# Patient Record
Sex: Male | Born: 1975 | Race: White | Hispanic: No | Marital: Married | Smoking: Former smoker
Health system: Southern US, Community
[De-identification: ages and names within clinical notes are randomized; demographics above are authoritative.]

## PROBLEM LIST (undated history)

## (undated) DIAGNOSIS — G4730 Sleep apnea, unspecified: Secondary | ICD-10-CM

## (undated) DIAGNOSIS — Z9889 Other specified postprocedural states: Secondary | ICD-10-CM

## (undated) DIAGNOSIS — I10 Essential (primary) hypertension: Secondary | ICD-10-CM

## (undated) DIAGNOSIS — R112 Nausea with vomiting, unspecified: Secondary | ICD-10-CM

## (undated) DIAGNOSIS — K429 Umbilical hernia without obstruction or gangrene: Secondary | ICD-10-CM

## (undated) HISTORY — DX: Sleep apnea, unspecified: G47.30

## (undated) HISTORY — PX: KNEE SURGERY: SHX244

## (undated) HISTORY — PX: TONSILLECTOMY: SUR1361

## (undated) HISTORY — DX: Essential (primary) hypertension: I10

---

## 2009-02-12 IMAGING — CR DG HIP COMPLETE 2+V*L*
2 series · 7 of 7 positions shown · non-contrast
Comparison: NONE

CLINICAL DATA: Hip pain for several months. 

LEFT HIP

[Series 1: view not recorded · 0.17mm/px · 4 of 4 slices shown (1 of 2)]
[im 1/4]
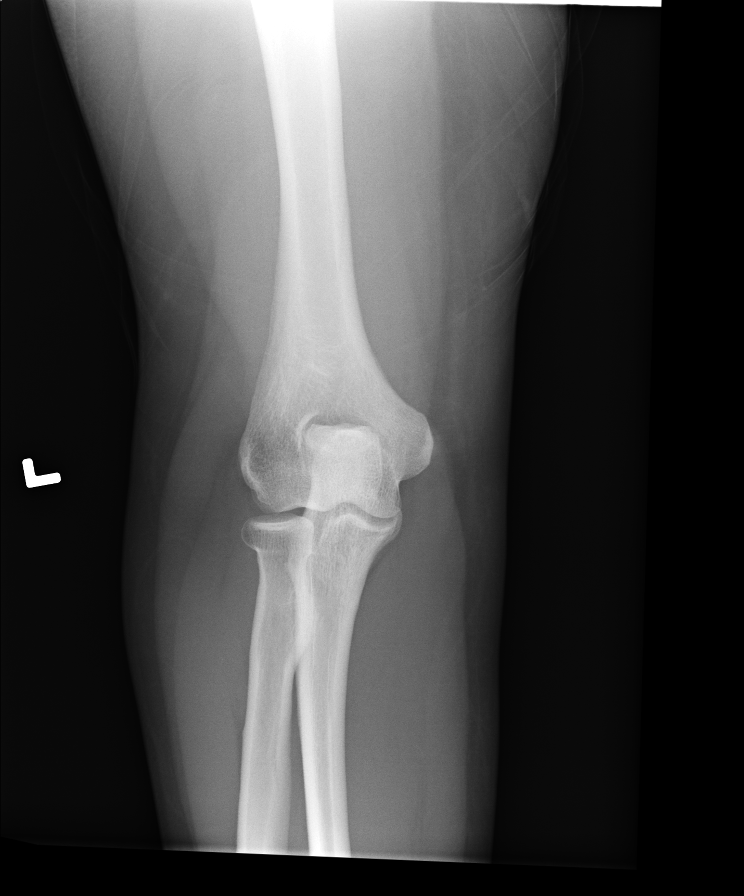
[im 2/4]
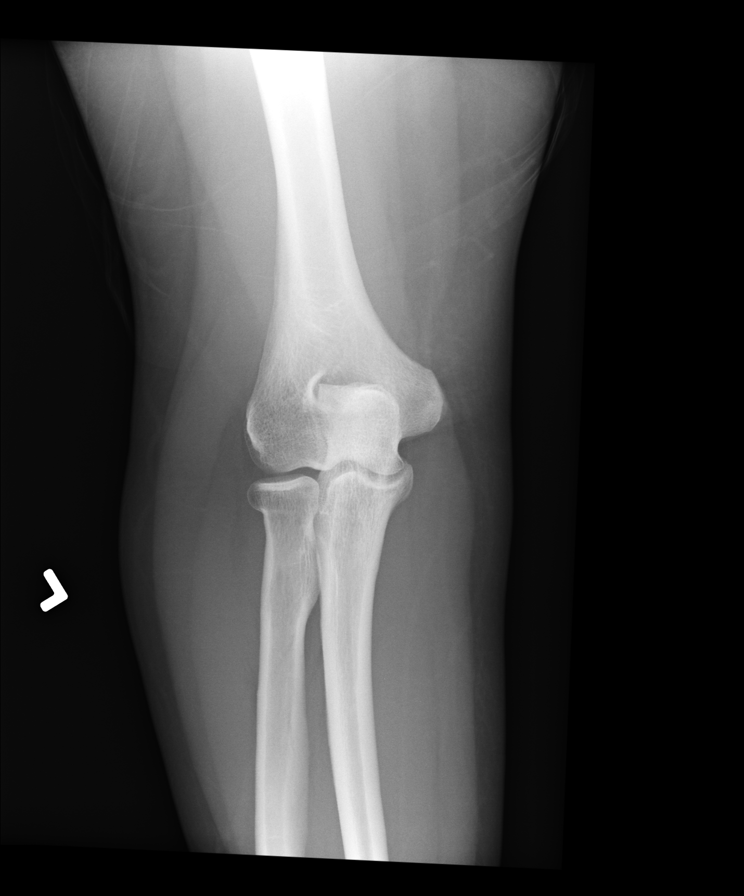
[im 3/4]
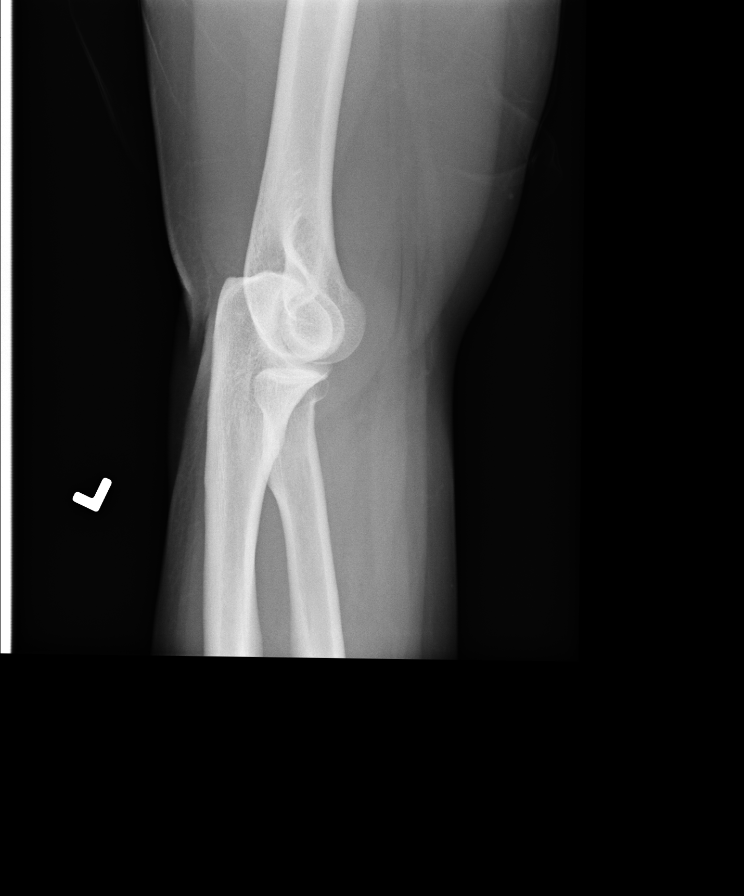
[im 4/4]
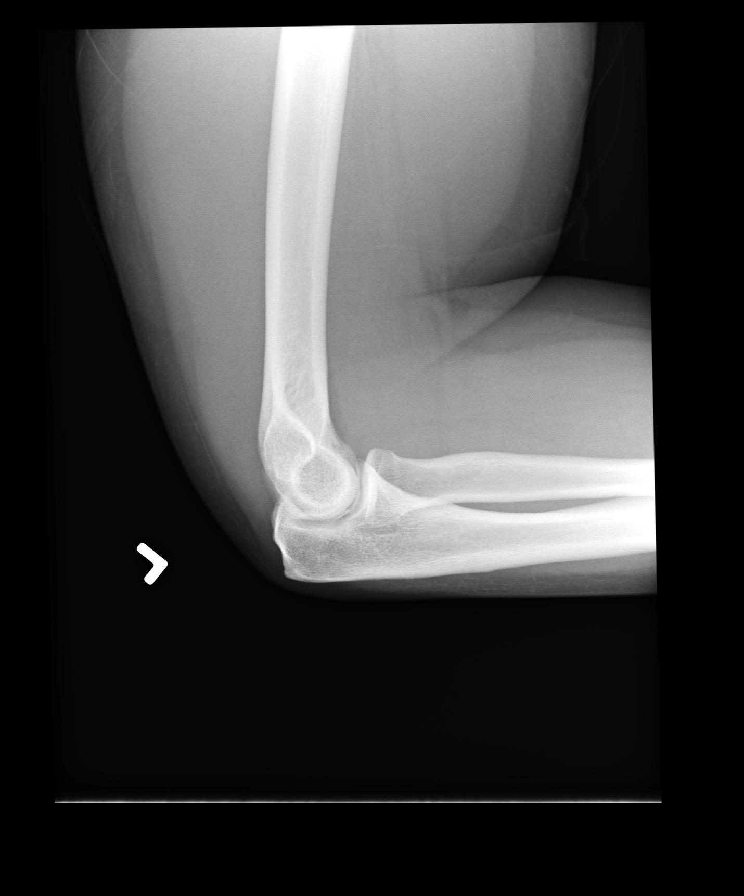

[Series 2: view not recorded · 0.17mm/px · 3 of 3 slices shown (2 of 2)]
[im 1/3]
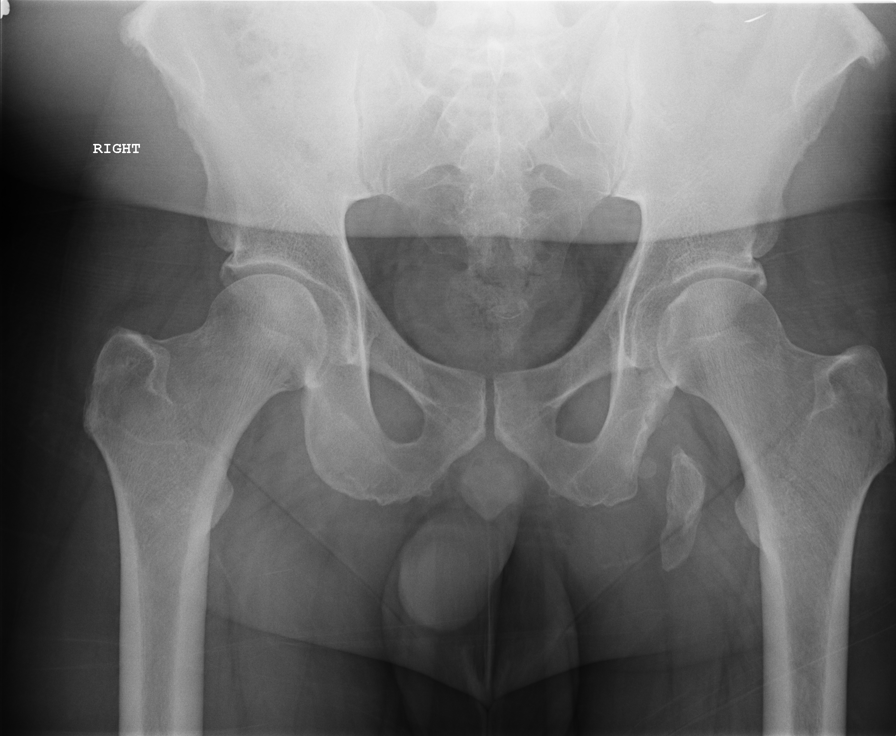
[im 2/3]
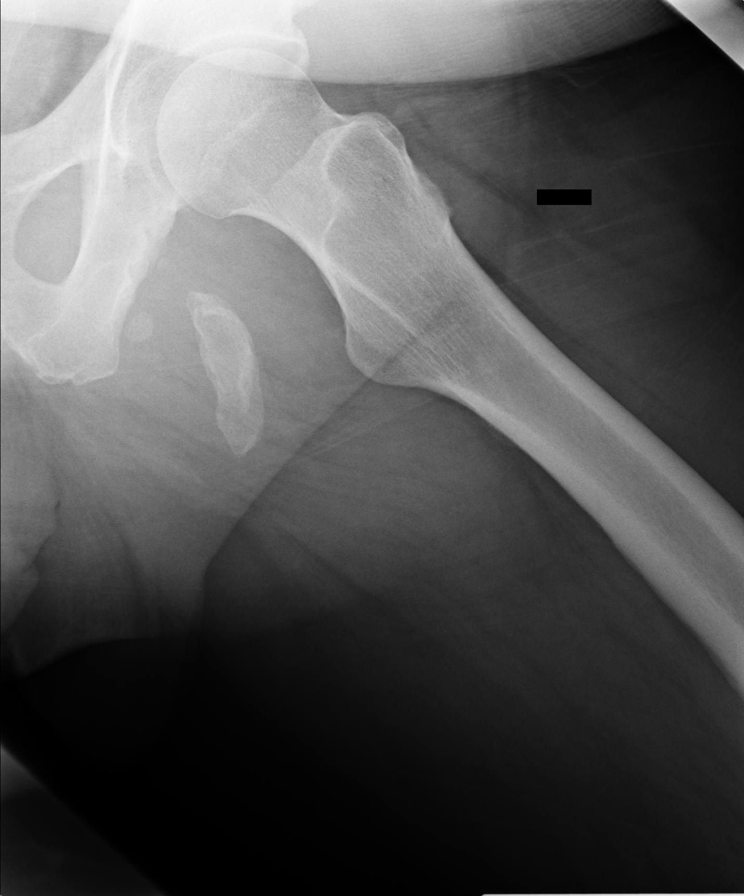
[im 3/3]
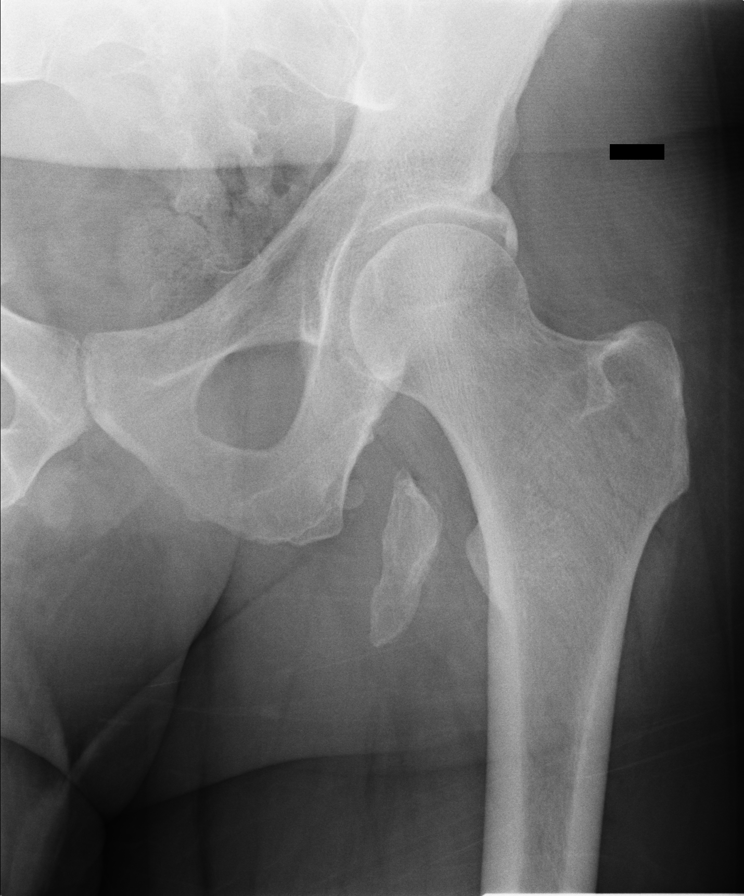

[7 of 7 positions shown; findings below may reference images not displayed]

FINDINGS: No fracture, dislocation, or loose body. There is a 
large calcific density in the medial upper thigh which measures 
approximately 6 cm in length and 1.5 cm in width and depth. This 
most likely represents an area of myositis ossificans.
IMPRESSION: Soft tissue calcification consistent with old injury 
and myositis ossificans. No acute changes involving the left hip. 
Date: 06/15/2007  Tran Date:  06/15/2007 DAS  [REDACTED]

## 2014-08-29 ENCOUNTER — Ambulatory Visit (HOSPITAL_BASED_OUTPATIENT_CLINIC_OR_DEPARTMENT_OTHER): Payer: BLUE CROSS/BLUE SHIELD | Attending: Otolaryngology | Admitting: Radiology

## 2014-08-29 VITALS — Ht 65.0 in | Wt 255.0 lb

## 2014-08-29 DIAGNOSIS — G4733 Obstructive sleep apnea (adult) (pediatric): Secondary | ICD-10-CM | POA: Diagnosis not present

## 2014-08-29 DIAGNOSIS — G4710 Hypersomnia, unspecified: Secondary | ICD-10-CM | POA: Diagnosis present

## 2014-08-29 DIAGNOSIS — R0683 Snoring: Secondary | ICD-10-CM | POA: Diagnosis not present

## 2014-09-03 DIAGNOSIS — R0683 Snoring: Secondary | ICD-10-CM | POA: Diagnosis not present

## 2014-09-03 NOTE — Sleep Study (Signed)
   NAME: Shaun Castillo DATE OF BIRTH:  04/15/1977 MEDICAL RECORD NUMBER [id number]  LOCATION: [facility]  PHYSICIAN: YOUNG,CLINTON D  DATE OF STUDY: 08/29/2014  SLEEP STUDY TYPE: Nocturnal Polysomnogram               REFERRING PHYSICIAN: Jodi Marble, MD  INDICATION FOR STUDY: Hypersomnia with sleep apnea  EPWORTH SLEEPINESS SCORE:   16/24 HEIGHT: 5' 5"  (165.1 cm)  WEIGHT: 115.667 kg (255 lb)    Body mass index is 42.43 kg/(m^2).  NECK SIZE: 19 in.  MEDICATIONS: Charted for review  SLEEP ARCHITECTURE: Split study protocol. During the diagnostic phase, total sleep time 119.5 minutes with sleep efficiency 81.8%. Stage I was 8.8%, stage II 91.2%, stages 3 and REM were absent. Sleep latency 12 minutes, REM latency N/A, awake after sleep onset 14.5 minutes, arousal index 32.6, bedtime medication: None  RESPIRATORY DATA: Apnea hypopnea index (AHI) 23.6 per hour. 47 total events scored including 11 obstructive apneas, 1 mixed apnea, 35 hypopneas. All events were nonsupine. CPAP titration to 11 CWP, AHI 2.4 per hour. He wore a small fullface mask.  OXYGEN DATA: Before CPAP snoring was moderate with oxygen desaturation to a nadir of 90% on room air. With CPAP control, snoring was prevented and mean saturation was 95.5%.  CARDIAC DATA: Normal sinus rhythm  MOVEMENT/PARASOMNIA: No significant movement disturbance, no bathroom trips  IMPRESSION/ RECOMMENDATION:   1) Moderate obstructive sleep apnea/hypopnea syndrome, AHI 23.6 per hour with nonsupine events. Moderate snoring with oxygen desaturation to a nadir of 90% on room air. 2) Successful CPAP titration to 11 CWP, AHI 2.4 per hour. He wore a small ResMed AirFit F-10 fullface mask with heated humidifier. Snoring was prevented and mean oxygen saturation was 95.5%.   Deneise Lever Diplomate, American Board of Sleep Medicine  ELECTRONICALLY SIGNED ON:  09/03/2014, 12:52 PM [place] PH:  [phone]   FX: [phone] [place]

## 2015-01-12 ENCOUNTER — Ambulatory Visit (INDEPENDENT_AMBULATORY_CARE_PROVIDER_SITE_OTHER): Payer: BLUE CROSS/BLUE SHIELD | Admitting: Neurology

## 2015-01-12 ENCOUNTER — Telehealth: Payer: Self-pay | Admitting: *Deleted

## 2015-01-12 ENCOUNTER — Ambulatory Visit: Payer: BLUE CROSS/BLUE SHIELD | Admitting: Neurology

## 2015-01-12 ENCOUNTER — Encounter: Payer: Self-pay | Admitting: Neurology

## 2015-01-12 VITALS — BP 135/79 | HR 84 | Ht 65.0 in | Wt 268.6 lb

## 2015-01-12 DIAGNOSIS — I10 Essential (primary) hypertension: Secondary | ICD-10-CM | POA: Diagnosis not present

## 2015-01-12 DIAGNOSIS — M5416 Radiculopathy, lumbar region: Secondary | ICD-10-CM

## 2015-01-12 DIAGNOSIS — G4733 Obstructive sleep apnea (adult) (pediatric): Secondary | ICD-10-CM

## 2015-01-12 DIAGNOSIS — R29898 Other symptoms and signs involving the musculoskeletal system: Secondary | ICD-10-CM | POA: Diagnosis not present

## 2015-01-12 DIAGNOSIS — G629 Polyneuropathy, unspecified: Secondary | ICD-10-CM | POA: Diagnosis not present

## 2015-01-12 DIAGNOSIS — W19XXXA Unspecified fall, initial encounter: Secondary | ICD-10-CM

## 2015-01-12 MED ORDER — LIDOCAINE 5 % EX PTCH: 3.0000 | MEDICATED_PATCH | CUTANEOUS | Status: AC

## 2015-01-12 MED ORDER — PREGABALIN 75 MG PO CAPS: 75.0000 mg | ORAL_CAPSULE | Freq: Two times a day (BID) | ORAL | Status: AC

## 2015-01-12 NOTE — Patient Instructions (Signed)
Overall you are doing fairly well but I do want to suggest a few things today:   Remember to drink plenty of fluid, eat healthy meals and do not skip any meals. Try to eat protein with a every meal and eat a healthy snack such as fruit or nuts in between meals. Try to keep a regular sleep-wake schedule and try to exercise daily, particularly in the form of walking, 20-30 minutes a day, if you can.   As far as your medications are concerned, I would like to suggest: Lyrica twice daily 36m  As far as diagnostic testing: MRi of the lumbar spine  I would like to see you back after imaging, sooner if we need to. Please call [facility] any interim questions, concerns, problems, updates or refill requests.   Our phone number is [phone] We also have an after hours call service for urgent matters and there is a physician on-call for urgent questions. For any emergencies you know to call 911 or go to the nearest emergency room

## 2015-01-12 NOTE — Progress Notes (Signed)
[facility]    Provider:  Dr Jaynee Eagles Referring Provider: No ref. provider found Primary Care Physician:  Velna Hatchet, MD  CC:  LBP and radicular symptoms  HPI:  Shaun Castillo is a 39 y.o. male here as a referral from Dr. No ref. provider found for LBP and radicular symptoms, numbness and left toe and burning pain down the left lateral thigh. He has a past medical history of hypertension and obesity, sleep apnea on CPAP.  For 8 months, progressive back and hip pain. He has low back pain with radiation into his leg with numbness in his toes. In Addition his left leg also hurts with pain down the outside of his lateral thigh. His left leg will go to sleep, toe will go to sleep, left leg will give out. Pain in the back is worsening. No inciting factors, no trauma or injury. He has gained 16 pounds in 8 months. The back pain started before the weight gain. Pain is described as a burning pain down the outside of the left leg to the knee. The numbness in the toe doesn't coincide with the burning thigh pain and they may be 2 different problems. The burning thigh pain is without association with walking, sitiitng, standing or laying down. However his back pain and toe numbness worsens with walking, His left leg gives out when he is walking with the pain, happened once. He has arm pain mostly joint pain in the elbows. No other focal neurologic symptoms. No changes in bowel or bladder. No saddle or groin anesthesia.  Reviewed notes, labs and imaging from outside physicians, which showed:  Patient brings imaging on a CD with him today. Personally reviewed x-rays of the lumbar spine. Degenerative changes shown most significant at L5-S1. Per report, X are of the left hip showed no acute findings or evidence of significant arthritis, heterotopic bone upper thigh, origin indeterminate.    Review of Systems: Patient complains of symptoms per HPI as well as the following symptoms: Joint pain,  aching muscles, memory loss, numbness, weakness. Pertinent negatives per HPI. All others negative.   Social History   Social History  . Marital Status: Unknown    Spouse Name: N/A  . Number of Children: 2  . Years of Education: BS   Occupational History  . [place] History Main Topics  . Smoking status: Former Smoker    Quit date: 05/06/1988  . Smokeless tobacco: Not on file  . Alcohol Use: No  . Drug Use: No  . Sexual Activity: Not on file   Other Topics Concern  . Not on file   Social History Narrative   Lives at home with Shauna Hugh and [place].   Caffeine use: 2 cups coffee per day    Family History  Problem Relation Age of Onset  . Diabetes Paternal Grandfather     Past Medical History  Diagnosis Date  . Sleep apnea   . Hypertension     Past Surgical History  Procedure Laterality Date  . Knee surgery      knees scoped     Current Outpatient Prescriptions  Medication Sig Dispense Refill  . CHONDROITIN SULFATE PO Take 1 tablet by mouth daily.    Marland Kitchen lisinopril (PRINIVIL,ZESTRIL) 20 MG tablet Take 20 mg by mouth daily.  2  . methocarbamol (ROBAXIN) 500 MG tablet Take 500 mg by mouth as needed.  1  . Multiple Vitamins-Minerals (MULTIVITAMIN ADULT PO) Take 1 tablet by mouth daily.    Marland Kitchen  Omega-3 Fatty Acids (FISH OIL) 1000 MG CAPS Take 1 capsule by mouth daily.    . pregabalin (LYRICA) 50 MG capsule Take 50 mg by mouth 2 (two) times daily.    . Vitamin D, Ergocalciferol, (DRISDOL) 50000 UNITS CAPS capsule Take 5,000 Units by mouth daily.     No current facility-administered medications for this visit.    Allergies as of 01/12/2015 - Review Complete 01/12/2015  Allergen Reaction Noted  . Other  01/12/2015  . Penicillins  01/12/2015    Vitals: BP 135/79 mmHg  Pulse 84  Ht 5' 5"  (1.651 m)  Wt 268 lb 9.6 oz (121.836 kg)  BMI 44.70 kg/m2 Last Weight:  Wt Readings from Last 1 Encounters:  01/12/15 268 lb 9.6 oz (121.836 kg)   Last Height:     Ht Readings from Last 1 Encounters:  01/12/15 5' 5"  (1.651 m)   Physical exam: Exam: Gen: NAD, conversant, well nourised, obese, well groomed                     CV: RRR, no MRG. No Carotid Bruits. No peripheral edema, warm, nontender Eyes: Conjunctivae clear without exudates or hemorrhage  Neuro: Detailed Neurologic Exam  Speech:    Speech is normal; fluent and spontaneous with normal comprehension.  Cognition:    The patient is oriented to person, place, and time;     recent and remote memory intact;     language fluent;     normal attention, concentration,     fund of knowledge Cranial Nerves:    The pupils are equal, round, and reactive to light. The fundi are normal and spontaneous venous pulsations are present. Visual fields are full to finger confrontation. Extraocular movements are intact. Trigeminal sensation is intact and the muscles of mastication are normal. The face is symmetric. The palate elevates in the midline. Hearing intact. Voice is normal. Shoulder shrug is normal. The tongue has normal motion without fasciculations.   Coordination:    Normal finger to nose and heel to shin. Normal rapid alternating movements.   Gait:    Heel-toe and tandem gait are normal.   Motor Observation:    No asymmetry, no atrophy, and no involuntary movements noted. Tone:    Normal muscle tone.    Posture:    Posture is normal. normal erect    Strength:    Strength is V/V in the upper and lower limbs.      Sensation: decreased left lateral cutaneous distribution.      Reflex Exam:  DTR's: Absent left patellar.     Toes:    The toes are downgoing bilaterally.   Clonus:    Clonus is absent.       Assessment/Plan:  39 year old male here for low back pain with radicular left leg symptoms and numbness in the left toe. He also has a burning pain in the left lateral thigh. I wonder if these are 2 separate conditions, the radicular symptoms and numbness in the toe could  be due to an L5-S1 radiculopathy and x-rays of his spine to show degenerative changes at that level. The burning on the left lateral thigh sounds more like meralgia paresthetica. The due to an MRI of the low back to be sure. Discussed this with patient and advised him that loss of weight and wearing loose fitting clothing may help if this indeed is meralgia paresthetica or lateral femoral cutaneous neuropathy. We'll give him Lidoderm patches for the thigh neuropathy and can  try him on Lyrica 75 mg twice daily.  Sarina Ill, MD  [facility] [address] [place], [place] [address]  Phone [phone] Fax [phone]

## 2015-01-12 NOTE — Telephone Encounter (Signed)
Spoke w/ Watt Climes from [facility] and she is going to fax over report for pt Lspine and left hip imaging. Told her he was in the office now with Dr. Jaynee Eagles. She verbalized understanding.

## 2015-01-13 LAB — BASIC METABOLIC PANEL
BUN/Creatinine Ratio: 26 — ABNORMAL HIGH (ref 9–20)
BUN: 19 mg/dL (ref 6–24)
CO2: 24 mmol/L (ref 18–29)
CREATININE: 0.74 mg/dL — AB (ref 0.76–1.27)
Calcium: 9.8 mg/dL (ref 8.7–10.2)
Chloride: 101 mmol/L (ref 97–108)
GFR calc Af Amer: 133 mL/min/{1.73_m2} (ref >59)
GFR, EST NON AFRICAN AMERICAN: 115 mL/min/{1.73_m2} (ref >59)
GLUCOSE: 105 mg/dL — AB (ref 65–99)
Potassium: 5.3 mmol/L — ABNORMAL HIGH (ref 3.5–5.2)
SODIUM: 141 mmol/L (ref 134–144)

## 2015-01-15 DIAGNOSIS — M5416 Radiculopathy, lumbar region: Secondary | ICD-10-CM | POA: Insufficient documentation

## 2015-01-15 DIAGNOSIS — G629 Polyneuropathy, unspecified: Secondary | ICD-10-CM | POA: Insufficient documentation

## 2015-01-15 DIAGNOSIS — I10 Essential (primary) hypertension: Secondary | ICD-10-CM | POA: Insufficient documentation

## 2015-01-15 DIAGNOSIS — G4733 Obstructive sleep apnea (adult) (pediatric): Secondary | ICD-10-CM | POA: Insufficient documentation

## 2015-01-17 ENCOUNTER — Telehealth: Payer: Self-pay | Admitting: *Deleted

## 2015-01-17 NOTE — Telephone Encounter (Signed)
-----   Message from Melvenia Beam, MD sent at 01/16/2015  6:49 PM EDT ----- Let patient know labs were unremarkable thanks

## 2015-01-17 NOTE — Telephone Encounter (Signed)
I called [facility] wk # that labs unremarkable.   If questions could call back.

## 2015-01-18 ENCOUNTER — Other Ambulatory Visit: Payer: BLUE CROSS/BLUE SHIELD

## 2015-01-25 ENCOUNTER — Other Ambulatory Visit: Payer: BLUE CROSS/BLUE SHIELD

## 2015-02-01 ENCOUNTER — Ambulatory Visit: Payer: BLUE CROSS/BLUE SHIELD

## 2015-02-12 ENCOUNTER — Ambulatory Visit
Admission: RE | Admit: 2015-02-12 | Discharge: 2015-02-12 | Disposition: A | Discharge status: Home / Self Care | Payer: BLUE CROSS/BLUE SHIELD | Source: Ambulatory Visit | Attending: Neurology | Admitting: Neurology

## 2015-02-12 DIAGNOSIS — M5416 Radiculopathy, lumbar region: Secondary | ICD-10-CM | POA: Diagnosis not present

## 2015-02-13 ENCOUNTER — Telehealth: Payer: Self-pay | Admitting: Neurology

## 2015-02-13 NOTE — Telephone Encounter (Signed)
Called patient to discuss the following. Left message to call back.  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 There is moderately severe right lateral recess stenosis with some encroachment of the right S1 nerve root but less potential for nerve root compression. Mild degenerative changes at L4-L5 do not lead to any nerve root impingement.

## 2015-02-14 ENCOUNTER — Other Ambulatory Visit: Payer: Self-pay | Admitting: Neurology

## 2015-02-14 DIAGNOSIS — M5417 Radiculopathy, lumbosacral region: Secondary | ICD-10-CM

## 2015-02-14 NOTE — Telephone Encounter (Signed)
Pt called returning Dr Ferdinand Lango' call. He can be reached at [phone]

## 2015-02-14 NOTE — Telephone Encounter (Signed)
Spoke to patient, referral sent place [facility]

## 2015-03-06 NOTE — Telephone Encounter (Signed)
Called [facility] back and spoke with Jeneen Rinks. He requested report be faxed to [phone]. Sent report and received fax confirmation.

## 2015-03-06 NOTE — Telephone Encounter (Signed)
James/[facility] [phone] called requesting MRI report from 02/12/15. They can see the report in [place], states Dr. Jaynee Eagles was to read the MRI. Patient is there now.

## 2015-03-06 NOTE — Telephone Encounter (Signed)
Fax over the report to him, thanks

## 2015-10-31 DIAGNOSIS — G4733 Obstructive sleep apnea (adult) (pediatric): Secondary | ICD-10-CM | POA: Diagnosis not present

## 2015-11-09 DIAGNOSIS — B353 Tinea pedis: Secondary | ICD-10-CM | POA: Diagnosis not present

## 2015-11-09 DIAGNOSIS — B360 Pityriasis versicolor: Secondary | ICD-10-CM | POA: Diagnosis not present

## 2015-11-09 DIAGNOSIS — L814 Other melanin hyperpigmentation: Secondary | ICD-10-CM | POA: Diagnosis not present

## 2015-11-09 DIAGNOSIS — D235 Other benign neoplasm of skin of trunk: Secondary | ICD-10-CM | POA: Diagnosis not present

## 2015-12-28 DIAGNOSIS — L309 Dermatitis, unspecified: Secondary | ICD-10-CM | POA: Diagnosis not present

## 2015-12-28 DIAGNOSIS — L219 Seborrheic dermatitis, unspecified: Secondary | ICD-10-CM | POA: Diagnosis not present

## 2015-12-28 DIAGNOSIS — L718 Other rosacea: Secondary | ICD-10-CM | POA: Diagnosis not present

## 2016-02-01 DIAGNOSIS — G4733 Obstructive sleep apnea (adult) (pediatric): Secondary | ICD-10-CM | POA: Diagnosis not present

## 2016-02-27 DIAGNOSIS — R7309 Other abnormal glucose: Secondary | ICD-10-CM | POA: Diagnosis not present

## 2016-02-27 DIAGNOSIS — I10 Essential (primary) hypertension: Secondary | ICD-10-CM | POA: Diagnosis not present

## 2016-02-27 DIAGNOSIS — Z125 Encounter for screening for malignant neoplasm of prostate: Secondary | ICD-10-CM | POA: Diagnosis not present

## 2016-03-05 DIAGNOSIS — G5702 Lesion of sciatic nerve, left lower limb: Secondary | ICD-10-CM | POA: Diagnosis not present

## 2016-03-05 DIAGNOSIS — Z0000 Encounter for general adult medical examination without abnormal findings: Secondary | ICD-10-CM | POA: Diagnosis not present

## 2016-03-05 DIAGNOSIS — Z1389 Encounter for screening for other disorder: Secondary | ICD-10-CM | POA: Diagnosis not present

## 2016-03-05 DIAGNOSIS — I10 Essential (primary) hypertension: Secondary | ICD-10-CM | POA: Diagnosis not present

## 2016-03-05 DIAGNOSIS — G4733 Obstructive sleep apnea (adult) (pediatric): Secondary | ICD-10-CM | POA: Diagnosis not present

## 2016-05-08 DIAGNOSIS — G4733 Obstructive sleep apnea (adult) (pediatric): Secondary | ICD-10-CM | POA: Diagnosis not present

## 2016-08-13 DIAGNOSIS — G4733 Obstructive sleep apnea (adult) (pediatric): Secondary | ICD-10-CM | POA: Diagnosis not present

## 2016-09-30 ENCOUNTER — Emergency Department (HOSPITAL_COMMUNITY)
Admission: EM | Admit: 2016-09-30 | Discharge: 2016-10-01 | Disposition: A | Discharge status: Home / Self Care | Payer: BLUE CROSS/BLUE SHIELD | Attending: Emergency Medicine | Admitting: Emergency Medicine

## 2016-09-30 ENCOUNTER — Emergency Department (HOSPITAL_COMMUNITY): Payer: BLUE CROSS/BLUE SHIELD

## 2016-09-30 ENCOUNTER — Encounter (HOSPITAL_COMMUNITY): Payer: Self-pay | Admitting: Family Medicine

## 2016-09-30 DIAGNOSIS — Y929 Unspecified place or not applicable: Secondary | ICD-10-CM | POA: Diagnosis not present

## 2016-09-30 DIAGNOSIS — X58XXXA Exposure to other specified factors, initial encounter: Secondary | ICD-10-CM | POA: Insufficient documentation

## 2016-09-30 DIAGNOSIS — Z79899 Other long term (current) drug therapy: Secondary | ICD-10-CM | POA: Insufficient documentation

## 2016-09-30 DIAGNOSIS — Y999 Unspecified external cause status: Secondary | ICD-10-CM | POA: Insufficient documentation

## 2016-09-30 DIAGNOSIS — I10 Essential (primary) hypertension: Secondary | ICD-10-CM | POA: Insufficient documentation

## 2016-09-30 DIAGNOSIS — S99921A Unspecified injury of right foot, initial encounter: Secondary | ICD-10-CM | POA: Diagnosis not present

## 2016-09-30 DIAGNOSIS — Y939 Activity, unspecified: Secondary | ICD-10-CM | POA: Diagnosis not present

## 2016-09-30 DIAGNOSIS — S91311A Laceration without foreign body, right foot, initial encounter: Secondary | ICD-10-CM | POA: Diagnosis not present

## 2016-09-30 DIAGNOSIS — Z87891 Personal history of nicotine dependence: Secondary | ICD-10-CM | POA: Insufficient documentation

## 2016-09-30 DIAGNOSIS — S91114A Laceration without foreign body of right lesser toe(s) without damage to nail, initial encounter: Secondary | ICD-10-CM | POA: Diagnosis not present

## 2016-09-30 MED ORDER — LIDOCAINE HCL (PF) 1 % IJ SOLN
5.0000 mL | Freq: Once | INTRAMUSCULAR | Status: AC
  Administered 2016-10-01: 5 mL via INTRADERMAL
  Filled 2016-09-30: qty 30

## 2016-09-30 NOTE — ED Notes (Signed)
Pt foot soaking in betadine and saline mix.

## 2016-09-30 NOTE — ED Triage Notes (Signed)
Patient reports he stepped on a dianorsor and it caused a laceration to the right little toe. Bleeding is controlled.

## 2016-09-30 NOTE — ED Provider Notes (Signed)
[facility] DEPT Provider Note   CSN: [id number] Arrival date & time: 09/30/16  2038  By signing my name below, I, Jeanell Sparrow, attest that this documentation has been prepared under the direction and in the presence of non-physician practitioner, Volanda Napoleon, PA-C. Electronically Signed: Jeanell Sparrow, Scribe. 09/30/2016. 9:47 PM.  History   Chief Complaint Chief Complaint  Patient presents with  . Extremity Laceration   The history is provided by the patient. No language interpreter was used.   HPI Comments: Shaun Castillo is a 41 y.o. male presents to the Emergency Department complaining of right fifth toe wound that occurred today. He states he accidentally stepped on a toy dinosaur at his house. He was not wearing shoes at the time. Patient reports that he has not taken any medications prior to arrival. Bleeding is controlled on ED arrival. His last tetanus immunization was 4 years ago. Denies any numbness/weakness of the foot.   PCP: Velna Hatchet, MD  Past Medical History:  Diagnosis Date  . Hypertension   . Sleep apnea     Patient Active Problem List   Diagnosis Date Noted  . Left lumbar radiculopathy 01/15/2015  . Neuropathy 01/15/2015  . Morbid obesity ([place]) 01/15/2015  . OSA (obstructive sleep apnea) 01/15/2015  . HTN (hypertension) 01/15/2015    Past Surgical History:  Procedure Laterality Date  . KNEE SURGERY     knees scoped        Home Medications    Prior to Admission medications   Medication Sig Start Date End Date Taking? Authorizing Provider  CHONDROITIN SULFATE PO Take 1 tablet by mouth daily.    [provider]  clindamycin (CLEOCIN) 300 MG capsule Take 1 capsule (300 mg total) by mouth 3 (three) times daily. 10/01/16   Volanda Napoleon, PA-C  HYDROcodone-acetaminophen (NORCO/VICODIN) 5-325 MG tablet Take 2 tablets by mouth every 4 (four) hours as needed. 10/01/16   [facility] A, PA-C  lidocaine (LIDODERM) 5 % Place 3  patches onto the skin daily. Remove & Discard patch within 12 hours or as directed by MD 01/12/15   Melvenia Beam, MD  lisinopril (PRINIVIL,ZESTRIL) 20 MG tablet Take 20 mg by mouth daily. 12/01/14   [provider]  methocarbamol (ROBAXIN) 500 MG tablet Take 500 mg by mouth as needed. 12/29/14   [provider]  Multiple Vitamins-Minerals (MULTIVITAMIN ADULT PO) Take 1 tablet by mouth daily.    [provider]  Omega-3 Fatty Acids (FISH OIL) 1000 MG CAPS Take 1 capsule by mouth daily.    [provider]  pregabalin (LYRICA) 75 MG capsule Take 1 capsule (75 mg total) by mouth 2 (two) times daily. 01/12/15   Melvenia Beam, MD  Vitamin D, Ergocalciferol, (DRISDOL) 50000 UNITS CAPS capsule Take 5,000 Units by mouth daily.    [provider]    Family History Family History  Problem Relation Age of Onset  . Diabetes Paternal Grandfather     Social History Social History  Substance Use Topics  . Smoking status: Former Smoker    Quit date: 05/06/1988  . Smokeless tobacco: Never Used  . Alcohol use No     Allergies   Other and Penicillins   Review of Systems Review of Systems  Skin: Positive for wound (Right foot).  Neurological: Negative for weakness and numbness.     Physical Exam Updated Vital Signs BP (!) 147/75 (BP Location: Left Arm)   Pulse 90   Temp 98.3 F (36.8  C) (Oral)   Resp 18   Ht 5' 5"  (1.651 m)   Wt 256 lb (116.1 kg)   SpO2 100%   BMI 42.60 kg/m   Physical Exam  Constitutional: He appears well-developed and well-nourished.  HENT:  Head: Normocephalic and atraumatic.  Eyes: Conjunctivae and EOM are normal. Right eye exhibits no discharge. Left eye exhibits no discharge. No scleral icterus.  Cardiovascular:  Pulses:      Dorsalis pedis pulses are 2+ on the right side, and 2+ on the left side.  Pulmonary/Chest: Effort normal.  Musculoskeletal:  Full flexion/extension of right ankle intact without difficulty.  Limited flexion of right fifth toe. Full extension intact. Flexion/extension of toes one through 4 of right foot intact fully.  Neurological: He is alert.  Skin: Skin is warm and dry. Capillary refill takes less than 2 seconds.  Right foot: 5th digit has a circumlinear laceration to the base  on the plantar surface.   Psychiatric: He has a normal mood and affect. His speech is normal and behavior is normal.  Nursing note and vitals reviewed.      ED Treatments / Results  DIAGNOSTIC STUDIES: Oxygen Saturation is 100% on RA, normal by my interpretation.    COORDINATION OF CARE: 9:51 PM- Pt advised of plan for treatment and pt agrees.  Labs (all labs ordered are listed, but only abnormal results are displayed) Labs Reviewed - No data to display  EKG  EKG Interpretation None       Radiology Dg Foot Complete Right  Result Date: 09/30/2016 CLINICAL DATA:  Puncture wound of foot. EXAM: RIGHT FOOT COMPLETE - 3+ VIEW COMPARISON:  None. FINDINGS: There is no evidence of fracture or dislocation. There is no evidence of arthropathy. Mild spurring of posterior calcaneus is noted. Soft tissues are unremarkable. IMPRESSION: No acute abnormality seen in the right foot. No radiopaque foreign body is seen. Electronically Signed   By: Marijo Conception, M.D.   On: 09/30/2016 21:52    Procedures .Marland KitchenLaceration Repair Date/Time: 09/30/2016 11:30 PM Performed by: [facility] A Authorized by: [facility] A   Consent:    Consent obtained:  Verbal   Consent given by:  Patient   Risks discussed:  Infection, poor cosmetic result and tendon damage   Alternatives discussed:  No treatment Anesthesia (see MAR for exact dosages):    Anesthesia method:  Local infiltration   Local anesthetic:  Lidocaine 1% w/o epi Laceration details:    Location:  Toe   Toe location:  R little toe Repair type:    Repair type:  Simple Pre-procedure details:    Preparation:  Patient was prepped and draped in  usual sterile fashion and imaging obtained to evaluate for foreign bodies Exploration:    Hemostasis achieved with:  Epinephrine   Wound exploration: wound explored through full range of motion   Treatment:    Area cleansed with:  Betadine   Amount of cleaning:  Extensive   Irrigation solution:  Sterile saline   Irrigation method:  Syringe   Visualized foreign bodies/material removed: no   Skin repair:    Repair method:  Sutures   Suture size:  4-0   Suture material:  Nylon   Suture technique:  Simple interrupted   Number of sutures:  4 Approximation:    Approximation:  Loose Post-procedure details:    Dressing:  Sterile dressing   Patient tolerance of procedure:  Tolerated well, no immediate complications       (including critical  care time)    Medications Ordered in ED Medications  lidocaine (PF) (XYLOCAINE) 1 % injection 5 mL (5 mLs Intradermal Given 10/01/16 0025)  HYDROcodone-acetaminophen (NORCO/VICODIN) 5-325 MG per tablet 2 tablet (2 tablets Oral Given 10/01/16 0025)     Initial Impression / Assessment and Plan / ED Course  I have reviewed the triage vital signs and the nursing notes.  Pertinent labs & imaging results that were available during my care of the patient were reviewed by me and considered in my medical decision making (see chart for details).     41 year old male who presents with right fifth digit laceration after stepping on a plastic toy at home. Obvious laceration to the base of the right fifth toe. Patient is neurovascularly intact. Will obtain x-ray for evaluation of foreign body or acute fracture. Patient states that his last tetanus within the last 4 years.  X-ray reviewed. Negative for any acute fracture or dislocation. Negative for any acute foreign body.   The wound was thoroughly cleaned and soaked in a Betadine/saline solution. After cleaning the wound was extensively irrigated with sterile saline. Once the patient was anesthetized the  wound was fully explored. No visualization of bone or tendon. No visualization of foreign body. Initial pain or the laceration attempted to closely approximate the wound edges. This in turn proved to be increasingly difficult due to the location and natural fold of the skin. Since the wound fold in a natural position and closely aligned, loose sutures were placed to help the wound naturally heal. The wound was repaired as documented above.   Sterile dressing and postop shoe provided. Analgesics provided in department. Plan is the patient within about a coverage. Patient instructed to follow-up with referred podiatrist in the next 24-48 hours for further evaluation. Strict wound care instructions given. Strict return precautions discussed. Patient expresses understanding and agreement to plan.   Final Clinical Impressions(s) / ED Diagnoses   Final diagnoses:  Laceration of right foot, initial encounter    New Prescriptions Discharge Medication List as of 10/01/2016 12:28 AM    START taking these medications   Details  clindamycin (CLEOCIN) 300 MG capsule Take 1 capsule (300 mg total) by mouth 3 (three) times daily., Starting Tue 10/01/2016, Print    HYDROcodone-acetaminophen (NORCO/VICODIN) 5-325 MG tablet Take 2 tablets by mouth every 4 (four) hours as needed., Starting Tue 10/01/2016, Print       I personally performed the services described in this documentation, which was scribed in my presence. The recorded information has been reviewed and is accurate.      Volanda Napoleon, PA-C 10/01/16 0300    Charlesetta Shanks, MD 10/04/16 1431

## 2016-10-01 ENCOUNTER — Ambulatory Visit (INDEPENDENT_AMBULATORY_CARE_PROVIDER_SITE_OTHER): Payer: BLUE CROSS/BLUE SHIELD | Admitting: Podiatry

## 2016-10-01 ENCOUNTER — Encounter: Payer: Self-pay | Admitting: Podiatry

## 2016-10-01 VITALS — BP 155/98 | HR 84

## 2016-10-01 DIAGNOSIS — S99921D Unspecified injury of right foot, subsequent encounter: Secondary | ICD-10-CM | POA: Diagnosis not present

## 2016-10-01 MED ORDER — HYDROCODONE-ACETAMINOPHEN 5-325 MG PO TABS: 2.0000 | ORAL_TABLET | ORAL | 0 refills | Status: AC | PRN

## 2016-10-01 MED ORDER — CLINDAMYCIN HCL 300 MG PO CAPS: 300.0000 mg | ORAL_CAPSULE | Freq: Three times a day (TID) | ORAL | 0 refills | Status: DC

## 2016-10-01 MED ORDER — HYDROCODONE-ACETAMINOPHEN 5-325 MG PO TABS
2.0000 | ORAL_TABLET | Freq: Once | ORAL | Status: AC
  Administered 2016-10-01: 2 via ORAL
  Filled 2016-10-01: qty 2

## 2016-10-01 NOTE — Progress Notes (Signed)
   Subjective:    Patient ID: Shaun Castillo, male    DOB: [birth year], 41 y.o.   MRN: [id number]  HPI this patient presents the office with chief complaint of a laceration under his fifth toe right foot. Patient states that yesterday he stepped on a rubber toe. I that caused a break in the skin under the fifth toe of the right foot. He says he was seen in the emergency room where x-rays were taken and the skin was reapproximated using suture. The x-rays revealed no pathology. He was also given a prescription for antibiotics to be taken for the next week. He was further told to make an appointment with the podiatrist for an evaluation of this injury and follow-up care. He presents the office today wearing the bandage that was applied at the emergency room    Review of Systems  All other systems reviewed and are negative.      Objective:   Physical Exam GENERAL APPEARANCE: Alert, conversant. Appropriately groomed. No acute distress.  VASCULAR: Pedal pulses are  palpable at  [facility] and PT bilateral.  Capillary refill time is immediate to all digits,  Normal temperature gradient.  Digital hair growth is present bilateral  NEUROLOGIC: sensation is normal to 5.07 monofilament at 5/5 sites bilateral.  Light touch is intact bilateral, Muscle strength normal.  MUSCULOSKELETAL: acceptable muscle strength, tone and stability bilateral.  Intrinsic muscluature intact bilateral.  Rectus appearance of foot and digits noted bilateral.   DERMATOLOGIC: skin color, texture, and turgor are within normal limits.  Examination of the skin under the fifth digit right foot at the level of the sulcus reveals good wound coaptation and sutures are intact. No evidence of any drainage or pus, redness or inflammation noted  No drainage noted.  Diagnosis   skin laceration under the sulcus of the fifth toe right foot  Initial exam removal of his bandage and examination of the laceration under the fifth toe right foot. Good wound  coaptation and sutures are intact. No evidence of any pus drainage or infection noted. Bandage was reapplied and the patient was told to keep this bandage on for 2 weeks until his next visit. He was told he he needs to cover the bandage while he showers.  He is to return the office in 2 weeks at which time the sutures will be removed. Call the office for any problems occur   Gardiner Barefoot DPM         Assessment & Plan:

## 2016-10-01 NOTE — Discharge Instructions (Signed)
Keep wound and clean with mild soap and water and covered with a topical antibiotic ointment and bandage.   Ice and elevate for additional pain relief.   Follow-up with the referred foot doctor. Call his office and arrange for any appointment to be seen in the next 24-48 hours.  Alternate between Ibuprofen and Tylenol for additional pain relief.   Follow up with the [facility] or the Emergency Dept in approximately 7 days for wound recheck and suture removal.   Monitor hand for signs of infection to include but not limited to increasing pain, redness, drainage, or swelling. Return to emergency department for emergent changing or worsening symptoms.

## 2016-10-01 NOTE — ED Notes (Signed)
Pt ambulatory and independent at discharge.  Verbalized understanding of discharge instructions 

## 2016-10-02 ENCOUNTER — Encounter: Payer: Self-pay | Admitting: Podiatry

## 2016-10-09 ENCOUNTER — Ambulatory Visit (INDEPENDENT_AMBULATORY_CARE_PROVIDER_SITE_OTHER): Payer: BLUE CROSS/BLUE SHIELD | Admitting: Podiatry

## 2016-10-09 ENCOUNTER — Encounter: Payer: Self-pay | Admitting: Podiatry

## 2016-10-09 VITALS — BP 148/88 | HR 98

## 2016-10-09 DIAGNOSIS — S99921D Unspecified injury of right foot, subsequent encounter: Secondary | ICD-10-CM | POA: Diagnosis not present

## 2016-10-09 NOTE — Progress Notes (Signed)
   Subjective:    Patient ID: Shaun Castillo, male    DOB: Sep 18, 1975, 41 y.o.   MRN: [id number]  HPI this patient presents the office with chief complaint of a laceration under his fifth toe right foot. He was initially seen in the emergency room where x-rays and antibiotics were prescribed. He was then seen in my office and I examined his fifth toe right foot, which revealed the sutures to be intact and no evidence of an infection. I re-bandaged the fifth toe right foot and gave him a surgical shoe. I also gave him light duty at work. He presents the office today for continued evaluation of this foot injury. He says he is having no pain or discomfort as he walks at the site of the injury    Review of Systems  All other systems reviewed and are negative.      Objective:   Physical Exam GENERAL APPEARANCE: Alert, conversant. Appropriately groomed. No acute distress.  VASCULAR: Pedal pulses are  palpable at  [facility] and PT bilateral.  Capillary refill time is immediate to all digits,  Normal temperature gradient.  Digital hair growth is present bilateral  NEUROLOGIC: sensation is normal to 5.07 monofilament at 5/5 sites bilateral.  Light touch is intact bilateral, Muscle strength normal.  MUSCULOSKELETAL: acceptable muscle strength, tone and stability bilateral.  Intrinsic muscluature intact bilateral.  Rectus appearance of foot and digits noted bilateral.   DERMATOLOGIC: skin color, texture, and turgor are within normal limits.  Examination of the skin under the fifth digit Reveals gapping noted at the lateral aspect of the plantar aspect of the fifth toe. No evidence of any redness, swelling or drainage or infection. Examination of the medial aspect of the plantar fifth toe does reveal maceration noted in the absence of any infection  Diagnosis   skin laceration under the sulcus of the fifth toe right foot  Treatment:  Removal of the bandage that he wore to the office for his appointment. Removal of  the sutures was performed. The toe was then re-bandage with Betadine soaked gauze pad and Coban. He was told that he needs to continue to wear his surgical shoe for another week and to keep his toe bandaged.  If there are any problems. He is to call the office and make an appointment for follow-up evaluation   Gardiner Barefoot DPM         Assessment & Plan:

## 2016-10-18 ENCOUNTER — Ambulatory Visit: Payer: Self-pay | Admitting: Podiatry

## 2016-11-15 DIAGNOSIS — G4733 Obstructive sleep apnea (adult) (pediatric): Secondary | ICD-10-CM | POA: Diagnosis not present

## 2017-02-15 DIAGNOSIS — Z0000 Encounter for general adult medical examination without abnormal findings: Secondary | ICD-10-CM | POA: Diagnosis not present

## 2017-02-20 DIAGNOSIS — G4733 Obstructive sleep apnea (adult) (pediatric): Secondary | ICD-10-CM | POA: Diagnosis not present

## 2017-03-14 DIAGNOSIS — G4733 Obstructive sleep apnea (adult) (pediatric): Secondary | ICD-10-CM | POA: Diagnosis not present

## 2017-03-14 DIAGNOSIS — Z23 Encounter for immunization: Secondary | ICD-10-CM | POA: Diagnosis not present

## 2017-03-14 DIAGNOSIS — K429 Umbilical hernia without obstruction or gangrene: Secondary | ICD-10-CM | POA: Diagnosis not present

## 2017-03-14 DIAGNOSIS — Z0000 Encounter for general adult medical examination without abnormal findings: Secondary | ICD-10-CM | POA: Diagnosis not present

## 2017-03-14 DIAGNOSIS — Z1389 Encounter for screening for other disorder: Secondary | ICD-10-CM | POA: Diagnosis not present

## 2017-03-14 DIAGNOSIS — I10 Essential (primary) hypertension: Secondary | ICD-10-CM | POA: Diagnosis not present

## 2017-04-03 DIAGNOSIS — M9905 Segmental and somatic dysfunction of pelvic region: Secondary | ICD-10-CM | POA: Diagnosis not present

## 2017-04-03 DIAGNOSIS — M9903 Segmental and somatic dysfunction of lumbar region: Secondary | ICD-10-CM | POA: Diagnosis not present

## 2017-04-03 DIAGNOSIS — M62830 Muscle spasm of back: Secondary | ICD-10-CM | POA: Diagnosis not present

## 2017-04-03 DIAGNOSIS — M9904 Segmental and somatic dysfunction of sacral region: Secondary | ICD-10-CM | POA: Diagnosis not present

## 2017-04-11 DIAGNOSIS — K429 Umbilical hernia without obstruction or gangrene: Secondary | ICD-10-CM | POA: Diagnosis not present

## 2017-04-15 ENCOUNTER — Ambulatory Visit: Payer: Self-pay | Admitting: General Surgery

## 2017-04-15 NOTE — H&P (Signed)
History of Present Illness  The patient is a 41 year old male who presents with an umbilical hernia. Referred by: Dr. Velna Hatchet Chief Complaint: Umbilical hernia  Patient is a 41 year old male with a past medical history sent for hypertension, sleep apnea, consistent with umbilical hernia. Patient states is been there for several years. He states that there is some pain and discomfort umbilical area. He states that this is more noticeable when playing with his children. He does state that he has some constipation as well as some abdominal pain secondary to the hernia itself. Patient had no signs or symptoms of strangulation. He does state that it stays fairly/incarcerated. There are no other modifying factors.    Past Surgical History Knee Surgery  Bilateral. Tonsillectomy   Allergies  Penicillins  Allergies Reconciled   Medication History  Lisinopril (20MG Tablet, Oral) Active. Medications Reconciled  Social History  Alcohol use  Occasional alcohol use. Caffeine use  Carbonated beverages, Coffee, Tea. No drug use  Tobacco use  Never smoker.  Family History  Arthritis  Father, Mother. Hypertension  Mother.  Other Problems  Umbilical Hernia Repair     Review of Systems  General Not Present- Appetite Loss, Chills, Fatigue, Fever, Night Sweats, Weight Gain and Weight Loss. Skin Not Present- Change in Wart/Mole, Dryness, Hives, Jaundice, New Lesions, Non-Healing Wounds, Rash and Ulcer. HEENT Not Present- Earache, Hearing Loss, Hoarseness, Nose Bleed, Oral Ulcers, Ringing in the Ears, Seasonal Allergies, Sinus Pain, Sore Throat, Visual Disturbances, Wears glasses/contact lenses and Yellow Eyes. Respiratory Not Present- Bloody sputum, Chronic Cough, Difficulty Breathing, Snoring and Wheezing. Breast Not Present- Breast Mass, Breast Pain, Nipple Discharge and Skin Changes. Cardiovascular Not Present- Chest Pain, Difficulty Breathing Lying Down, Leg Cramps,  Palpitations, Rapid Heart Rate, Shortness of Breath and Swelling of Extremities. Gastrointestinal Present- Change in Bowel Habits. Not Present- Abdominal Pain, Bloating, Bloody Stool, Chronic diarrhea, Constipation, Difficulty Swallowing, Excessive gas, Gets full quickly at meals, Hemorrhoids, Indigestion, Nausea, Rectal Pain and Vomiting. Male Genitourinary Not Present- Blood in Urine, Change in Urinary Stream, Frequency, Impotence, Nocturia, Painful Urination, Urgency and Urine Leakage. Musculoskeletal Not Present- Back Pain, Joint Pain, Joint Stiffness, Muscle Pain, Muscle Weakness and Swelling of Extremities. Neurological Not Present- Decreased Memory, Fainting, Headaches, Numbness, Seizures, Tingling, Tremor, Trouble walking and Weakness. Psychiatric Not Present- Anxiety, Bipolar, Change in Sleep Pattern, Depression, Fearful and Frequent crying. Endocrine Not Present- Cold Intolerance, Excessive Hunger, Hair Changes, Heat Intolerance, Hot flashes and New Diabetes. Hematology Not Present- Blood Thinners, Easy Bruising, Excessive bleeding, Gland problems, HIV and Persistent Infections. All other systems negative  Vitals 04/11/2017 3:31 PM Weight: 259.5 lb Height: 65in Body Surface Area: 2.21 m Body Mass Index: 43.18 kg/m  Temp.: 97.73F  Pulse: 86 (Regular)  BP: 134/82 (Sitting, Left Arm, Standard)       Physical Exam  The physical exam findings are as follows: Note:Constitutional: No acute distress, conversant, appears stated age  Eyes: Anicteric sclerae, moist conjunctiva, no lid lag  Neck: No thyromegaly, trachea midline, no cervical lymphadenopathy  Lungs: Clear to auscultation biilaterally, normal respiratory effot  Cardiovascular: regular rate & rhythm, no murmurs, no peripheal edema, pedal pulses 2+  GI: Soft, no masses or hepatosplenomegaly, non-tender to palpation  MSK: Normal gait, no clubbing cyanosis, edema  Skin: No rashes, palpation reveals normal  skin turgor  Psychiatric: Appropriate judgment and insight, oriented to person, place, and time  Abdomen Inspection Hernias - Umbilical hernia - Incarcerated.    Assessment & Plan UMBILICAL HERNIA WITHOUT OBSTRUCTION  AND WITHOUT GANGRENE (K42.9) Impression: 41 year old male with a incarcerated umbilical hernia, chronically.  1. The patient will like to proceed to the operating room for laparoscopic umbilical hernia repair with mesh.  2. I discussed with the patient the signs and symptoms of incarceration and strangulation and the need to proceed to the ER should they occur.  3. I discussed with the patient the risks and benefits of the procedure to include but not limited to: Infection, bleeding, damage to surrounding structures, possible need for further surgery, possible nerve pain, and possible recurrence. The patient was understanding and wishes to proceed.

## 2017-04-15 NOTE — H&P (View-Only) (Signed)
History of Present Illness  The patient is a 41 year old male who presents with an umbilical hernia. Referred by: Dr. Velna Hatchet Chief Complaint: Umbilical hernia  Patient is a 42 year old male with a past medical history sent for hypertension, sleep apnea, consistent with umbilical hernia. Patient states is been there for several years. He states that there is some pain and discomfort umbilical area. He states that this is more noticeable when playing with his children. He does state that he has some constipation as well as some abdominal pain secondary to the hernia itself. Patient had no signs or symptoms of strangulation. He does state that it stays fairly/incarcerated. There are no other modifying factors.    Past Surgical History Knee Surgery  Bilateral. Tonsillectomy   Allergies  Penicillins  Allergies Reconciled   Medication History  Lisinopril (20MG Tablet, Oral) Active. Medications Reconciled  Social History  Alcohol use  Occasional alcohol use. Caffeine use  Carbonated beverages, Coffee, Tea. No drug use  Tobacco use  Never smoker.  Family History  Arthritis  Father, Mother. Hypertension  Mother.  Other Problems  Umbilical Hernia Repair     Review of Systems  General Not Present- Appetite Loss, Chills, Fatigue, Fever, Night Sweats, Weight Gain and Weight Loss. Skin Not Present- Change in Wart/Mole, Dryness, Hives, Jaundice, New Lesions, Non-Healing Wounds, Rash and Ulcer. HEENT Not Present- Earache, Hearing Loss, Hoarseness, Nose Bleed, Oral Ulcers, Ringing in the Ears, Seasonal Allergies, Sinus Pain, Sore Throat, Visual Disturbances, Wears glasses/contact lenses and Yellow Eyes. Respiratory Not Present- Bloody sputum, Chronic Cough, Difficulty Breathing, Snoring and Wheezing. Breast Not Present- Breast Mass, Breast Pain, Nipple Discharge and Skin Changes. Cardiovascular Not Present- Chest Pain, Difficulty Breathing Lying Down, Leg Cramps,  Palpitations, Rapid Heart Rate, Shortness of Breath and Swelling of Extremities. Gastrointestinal Present- Change in Bowel Habits. Not Present- Abdominal Pain, Bloating, Bloody Stool, Chronic diarrhea, Constipation, Difficulty Swallowing, Excessive gas, Gets full quickly at meals, Hemorrhoids, Indigestion, Nausea, Rectal Pain and Vomiting. Male Genitourinary Not Present- Blood in Urine, Change in Urinary Stream, Frequency, Impotence, Nocturia, Painful Urination, Urgency and Urine Leakage. Musculoskeletal Not Present- Back Pain, Joint Pain, Joint Stiffness, Muscle Pain, Muscle Weakness and Swelling of Extremities. Neurological Not Present- Decreased Memory, Fainting, Headaches, Numbness, Seizures, Tingling, Tremor, Trouble walking and Weakness. Psychiatric Not Present- Anxiety, Bipolar, Change in Sleep Pattern, Depression, Fearful and Frequent crying. Endocrine Not Present- Cold Intolerance, Excessive Hunger, Hair Changes, Heat Intolerance, Hot flashes and New Diabetes. Hematology Not Present- Blood Thinners, Easy Bruising, Excessive bleeding, Gland problems, HIV and Persistent Infections. All other systems negative  Vitals 04/11/2017 3:31 PM Weight: 259.5 lb Height: 65in Body Surface Area: 2.21 m Body Mass Index: 43.18 kg/m  Temp.: 97.34F  Pulse: 86 (Regular)  BP: 134/82 (Sitting, Left Arm, Standard)       Physical Exam  The physical exam findings are as follows: Note:Constitutional: No acute distress, conversant, appears stated age  Eyes: Anicteric sclerae, moist conjunctiva, no lid lag  Neck: No thyromegaly, trachea midline, no cervical lymphadenopathy  Lungs: Clear to auscultation biilaterally, normal respiratory effot  Cardiovascular: regular rate & rhythm, no murmurs, no peripheal edema, pedal pulses 2+  GI: Soft, no masses or hepatosplenomegaly, non-tender to palpation  MSK: Normal gait, no clubbing cyanosis, edema  Skin: No rashes, palpation reveals normal  skin turgor  Psychiatric: Appropriate judgment and insight, oriented to person, place, and time  Abdomen Inspection Hernias - Umbilical hernia - Incarcerated.    Assessment & Plan UMBILICAL HERNIA WITHOUT OBSTRUCTION  AND WITHOUT GANGRENE (K42.9) Impression: 41 year old male with a incarcerated umbilical hernia, chronically.  1. The patient will like to proceed to the operating room for laparoscopic umbilical hernia repair with mesh.  2. I discussed with the patient the signs and symptoms of incarceration and strangulation and the need to proceed to the ER should they occur.  3. I discussed with the patient the risks and benefits of the procedure to include but not limited to: Infection, bleeding, damage to surrounding structures, possible need for further surgery, possible nerve pain, and possible recurrence. The patient was understanding and wishes to proceed.

## 2017-04-23 ENCOUNTER — Encounter (HOSPITAL_COMMUNITY): Payer: Self-pay

## 2017-04-23 ENCOUNTER — Encounter (HOSPITAL_COMMUNITY)
Admission: RE | Admit: 2017-04-23 | Discharge: 2017-04-23 | Disposition: A | Discharge status: Home / Self Care | Payer: BLUE CROSS/BLUE SHIELD | Source: Ambulatory Visit | Attending: General Surgery | Admitting: General Surgery

## 2017-04-23 ENCOUNTER — Other Ambulatory Visit: Payer: Self-pay

## 2017-04-23 DIAGNOSIS — Z01812 Encounter for preprocedural laboratory examination: Secondary | ICD-10-CM | POA: Insufficient documentation

## 2017-04-23 DIAGNOSIS — I10 Essential (primary) hypertension: Secondary | ICD-10-CM | POA: Diagnosis not present

## 2017-04-23 DIAGNOSIS — Z01810 Encounter for preprocedural cardiovascular examination: Secondary | ICD-10-CM | POA: Diagnosis not present

## 2017-04-23 HISTORY — DX: Other specified postprocedural states: Z98.890

## 2017-04-23 HISTORY — DX: Nausea with vomiting, unspecified: R11.2

## 2017-04-23 HISTORY — DX: Umbilical hernia without obstruction or gangrene: K42.9

## 2017-04-23 LAB — BASIC METABOLIC PANEL
ANION GAP: 7 (ref 5–15)
BUN: 15 mg/dL (ref 6–20)
CO2: 28 mmol/L (ref 22–32)
Calcium: 9.5 mg/dL (ref 8.9–10.3)
Chloride: 104 mmol/L (ref 101–111)
Creatinine, Ser: 0.79 mg/dL (ref 0.61–1.24)
GFR calc Af Amer: >60 mL/min (ref >60)
GLUCOSE: 98 mg/dL (ref 65–99)
POTASSIUM: 4.7 mmol/L (ref 3.5–5.1)
Sodium: 139 mmol/L (ref 135–145)

## 2017-04-23 LAB — CBC
HEMATOCRIT: 45.4 % (ref 39.0–52.0)
HEMOGLOBIN: 14.9 g/dL (ref 13.0–17.0)
MCH: 28.8 pg (ref 26.0–34.0)
MCHC: 32.8 g/dL (ref 30.0–36.0)
MCV: 87.6 fL (ref 78.0–100.0)
Platelets: 252 10*3/uL (ref 150–400)
RBC: 5.18 MIL/uL (ref 4.22–5.81)
RDW: 13.6 % (ref 11.5–15.5)
WBC: 12.6 10*3/uL — ABNORMAL HIGH (ref 4.0–10.5)

## 2017-04-23 NOTE — Patient Instructions (Signed)
Shaun Castillo  04/23/2017   Your procedure is scheduled on: 04-30-17  Report to [facility] Main  Entrance Take [place]  elevators to 3rd floor to  [place] at 7:45AM.   Call this number if you have problems the morning of surgery [phone]    Remember: ONLY 1 PERSON MAY GO WITH YOU TO SHORT STAY TO GET  READY MORNING OF [name].    Do not eat food or drink liquids :After Midnight.    PLEASE BRING CPAP MASK AND TUBING ONLY TO HOSPITAL. DEVICE WILL BE PROVIDED !!   Take these medicines the morning of surgery with A SIP OF WATER: NONE                                You may not have any metal on your body including hair pins and              piercings  Do not wear jewelry, make-up, lotions, powders or perfumes, deodorant                     Men may shave face and neck.   Do not bring valuables to the hospital. [name].  Contacts, dentures or bridgework may not be worn into surgery.      Patients discharged the day of surgery will not be allowed to drive home.  Name and phone number of your driver:  Special Instructions: N/A              Please read over the following fact sheets you were given: _____________________________________________________________________             [facility] - Preparing for Surgery Before surgery, you can play an important role.  Because skin is not sterile, your skin needs to be as free of germs as possible.  You can reduce the number of germs on your skin by washing with CHG (chlorahexidine gluconate) soap before surgery.  CHG is an antiseptic cleaner which kills germs and bonds with the skin to continue killing germs even after washing. Please DO NOT use if you have an allergy to CHG or antibacterial soaps.  If your skin becomes reddened/irritated stop using the CHG and inform your nurse when you arrive at Short Stay. Do not shave (including legs and  underarms) for at least 48 hours prior to the first CHG shower.  You may shave your face/neck. Please follow these instructions carefully:  1.  Shower with CHG Soap the night before surgery and the  morning of Surgery.  2.  If you choose to wash your hair, wash your hair first as usual with your  normal  shampoo.  3.  After you shampoo, rinse your hair and body thoroughly to remove the  shampoo.                           4.  Use CHG as you would any other liquid soap.  You can apply chg directly  to the skin and wash                       Gently with a scrungie or  clean washcloth.  5.  Apply the CHG Soap to your body ONLY FROM THE NECK DOWN.   Do not use on face/ open                           Wound or open sores. Avoid contact with eyes, ears mouth and genitals (private parts).                       Wash face,  Genitals (private parts) with your normal soap.             6.  Wash thoroughly, paying special attention to the area where your surgery  will be performed.  7.  Thoroughly rinse your body with warm water from the neck down.  8.  DO NOT shower/wash with your normal soap after using and rinsing off  the CHG Soap.                9.  Pat yourself dry with a clean towel.            10.  Wear clean pajamas.            11.  Place clean sheets on your bed the night of your first shower and do not  sleep with pets. Day of Surgery : Do not apply any lotions/deodorants the morning of surgery.  Please wear clean clothes to the hospital/surgery center.  FAILURE TO FOLLOW THESE INSTRUCTIONS MAY RESULT IN THE CANCELLATION OF YOUR SURGERY PATIENT SIGNATURE_________________________________  NURSE SIGNATURE__________________________________  ________________________________________________________________________

## 2017-04-24 DIAGNOSIS — M62830 Muscle spasm of back: Secondary | ICD-10-CM | POA: Diagnosis not present

## 2017-04-24 DIAGNOSIS — M9903 Segmental and somatic dysfunction of lumbar region: Secondary | ICD-10-CM | POA: Diagnosis not present

## 2017-04-24 DIAGNOSIS — M9904 Segmental and somatic dysfunction of sacral region: Secondary | ICD-10-CM | POA: Diagnosis not present

## 2017-04-24 DIAGNOSIS — M9905 Segmental and somatic dysfunction of pelvic region: Secondary | ICD-10-CM | POA: Diagnosis not present

## 2017-04-29 MED ORDER — VANCOMYCIN HCL 10 G IV SOLR
1500.0000 mg | INTRAVENOUS | Status: AC
  Administered 2017-04-30: 1500 mg via INTRAVENOUS
  Filled 2017-04-29: qty 1500

## 2017-04-30 ENCOUNTER — Ambulatory Visit (HOSPITAL_COMMUNITY): Payer: BLUE CROSS/BLUE SHIELD | Admitting: Anesthesiology

## 2017-04-30 ENCOUNTER — Encounter (HOSPITAL_COMMUNITY): Payer: Self-pay | Admitting: Emergency Medicine

## 2017-04-30 ENCOUNTER — Ambulatory Visit (HOSPITAL_COMMUNITY)
Admission: RE | Admit: 2017-04-30 | Discharge: 2017-04-30 | Disposition: A | Discharge status: Home / Self Care | Payer: BLUE CROSS/BLUE SHIELD | Source: Ambulatory Visit | Attending: General Surgery | Admitting: General Surgery

## 2017-04-30 ENCOUNTER — Other Ambulatory Visit: Payer: Self-pay

## 2017-04-30 ENCOUNTER — Encounter (HOSPITAL_COMMUNITY)
Admission: RE | Disposition: A | Discharge status: Home / Self Care | Payer: Self-pay | Source: Ambulatory Visit | Attending: General Surgery

## 2017-04-30 DIAGNOSIS — Z8261 Family history of arthritis: Secondary | ICD-10-CM | POA: Insufficient documentation

## 2017-04-30 DIAGNOSIS — Z79899 Other long term (current) drug therapy: Secondary | ICD-10-CM | POA: Diagnosis not present

## 2017-04-30 DIAGNOSIS — Z880 Allergy status to penicillin: Secondary | ICD-10-CM | POA: Diagnosis not present

## 2017-04-30 DIAGNOSIS — Z9889 Other specified postprocedural states: Secondary | ICD-10-CM | POA: Insufficient documentation

## 2017-04-30 DIAGNOSIS — I10 Essential (primary) hypertension: Secondary | ICD-10-CM | POA: Insufficient documentation

## 2017-04-30 DIAGNOSIS — M5416 Radiculopathy, lumbar region: Secondary | ICD-10-CM | POA: Diagnosis not present

## 2017-04-30 DIAGNOSIS — Z8249 Family history of ischemic heart disease and other diseases of the circulatory system: Secondary | ICD-10-CM | POA: Diagnosis not present

## 2017-04-30 DIAGNOSIS — Z87891 Personal history of nicotine dependence: Secondary | ICD-10-CM | POA: Diagnosis not present

## 2017-04-30 DIAGNOSIS — G4730 Sleep apnea, unspecified: Secondary | ICD-10-CM | POA: Insufficient documentation

## 2017-04-30 DIAGNOSIS — K420 Umbilical hernia with obstruction, without gangrene: Secondary | ICD-10-CM | POA: Diagnosis not present

## 2017-04-30 DIAGNOSIS — K429 Umbilical hernia without obstruction or gangrene: Secondary | ICD-10-CM | POA: Diagnosis not present

## 2017-04-30 DIAGNOSIS — K5900 Constipation, unspecified: Secondary | ICD-10-CM | POA: Insufficient documentation

## 2017-04-30 DIAGNOSIS — Z96653 Presence of artificial knee joint, bilateral: Secondary | ICD-10-CM | POA: Diagnosis not present

## 2017-04-30 DIAGNOSIS — G4733 Obstructive sleep apnea (adult) (pediatric): Secondary | ICD-10-CM | POA: Diagnosis not present

## 2017-04-30 HISTORY — PX: UMBILICAL HERNIA REPAIR: SHX196

## 2017-04-30 HISTORY — PX: INSERTION OF MESH: SHX5868

## 2017-04-30 SURGERY — REPAIR, HERNIA, UMBILICAL, LAPAROSCOPIC
Anesthesia: General | Site: Abdomen

## 2017-04-30 MED ORDER — PROMETHAZINE HCL 25 MG/ML IJ SOLN: 6.2500 mg | INTRAMUSCULAR | Status: DC | PRN

## 2017-04-30 MED ORDER — PROPOFOL 10 MG/ML IV BOLUS
INTRAVENOUS | Status: AC
  Filled 2017-04-30: qty 20

## 2017-04-30 MED ORDER — LACTATED RINGERS IR SOLN
Status: DC | PRN
  Administered 2017-04-30: 1000 mL

## 2017-04-30 MED ORDER — FENTANYL CITRATE (PF) 100 MCG/2ML IJ SOLN
INTRAMUSCULAR | Status: AC
  Filled 2017-04-30: qty 2

## 2017-04-30 MED ORDER — SUCCINYLCHOLINE CHLORIDE 200 MG/10ML IV SOSY
PREFILLED_SYRINGE | INTRAVENOUS | Status: DC | PRN
  Administered 2017-04-30: 200 mg via INTRAVENOUS

## 2017-04-30 MED ORDER — FENTANYL CITRATE (PF) 100 MCG/2ML IJ SOLN
25.0000 ug | INTRAMUSCULAR | Status: DC | PRN
  Administered 2017-04-30 (×2): 50 ug via INTRAVENOUS

## 2017-04-30 MED ORDER — PROPOFOL 10 MG/ML IV BOLUS
INTRAVENOUS | Status: DC | PRN
  Administered 2017-04-30: 180 mg via INTRAVENOUS

## 2017-04-30 MED ORDER — LIDOCAINE 2% (20 MG/ML) 5 ML SYRINGE
INTRAMUSCULAR | Status: DC | PRN
  Administered 2017-04-30: 50 mg via INTRAVENOUS

## 2017-04-30 MED ORDER — ROCURONIUM BROMIDE 10 MG/ML (PF) SYRINGE
PREFILLED_SYRINGE | INTRAVENOUS | Status: DC | PRN
  Administered 2017-04-30: 40 mg via INTRAVENOUS
  Administered 2017-04-30 (×2): 10 mg via INTRAVENOUS

## 2017-04-30 MED ORDER — CHLORHEXIDINE GLUCONATE CLOTH 2 % EX PADS: 6.0000 | MEDICATED_PAD | Freq: Once | CUTANEOUS | Status: DC

## 2017-04-30 MED ORDER — DEXAMETHASONE SODIUM PHOSPHATE 10 MG/ML IJ SOLN
INTRAMUSCULAR | Status: DC | PRN
  Administered 2017-04-30: 10 mg via INTRAVENOUS

## 2017-04-30 MED ORDER — MEPERIDINE HCL 50 MG/ML IJ SOLN: 6.2500 mg | INTRAMUSCULAR | Status: DC | PRN

## 2017-04-30 MED ORDER — FENTANYL CITRATE (PF) 250 MCG/5ML IJ SOLN
INTRAMUSCULAR | Status: AC
  Filled 2017-04-30: qty 5

## 2017-04-30 MED ORDER — MIDAZOLAM HCL 2 MG/2ML IJ SOLN
INTRAMUSCULAR | Status: AC
  Filled 2017-04-30: qty 2

## 2017-04-30 MED ORDER — FENTANYL CITRATE (PF) 100 MCG/2ML IJ SOLN
INTRAMUSCULAR | Status: DC | PRN
  Administered 2017-04-30 (×2): 50 ug via INTRAVENOUS
  Administered 2017-04-30: 100 ug via INTRAVENOUS
  Administered 2017-04-30: 50 ug via INTRAVENOUS

## 2017-04-30 MED ORDER — 0.9 % SODIUM CHLORIDE (POUR BTL) OPTIME
TOPICAL | Status: DC | PRN
  Administered 2017-04-30: 1000 mL

## 2017-04-30 MED ORDER — HYDROMORPHONE HCL 2 MG/ML IJ SOLN
INTRAMUSCULAR | Status: AC
  Filled 2017-04-30: qty 1

## 2017-04-30 MED ORDER — BUPIVACAINE-EPINEPHRINE 0.25% -1:200000 IJ SOLN
INTRAMUSCULAR | Status: AC
  Filled 2017-04-30: qty 1

## 2017-04-30 MED ORDER — HYDROMORPHONE HCL 1 MG/ML IJ SOLN
INTRAMUSCULAR | Status: DC | PRN
  Administered 2017-04-30 (×2): 0.5 mg via INTRAVENOUS

## 2017-04-30 MED ORDER — OXYCODONE-ACETAMINOPHEN 5-325 MG PO TABS: 1.0000 | ORAL_TABLET | Freq: Four times a day (QID) | ORAL | 0 refills | Status: AC | PRN

## 2017-04-30 MED ORDER — LACTATED RINGERS IV SOLN
INTRAVENOUS | Status: DC
  Administered 2017-04-30: 09:00:00 via INTRAVENOUS

## 2017-04-30 SURGICAL SUPPLY — 34 items
BENZOIN TINCTURE PRP APPL 2/3 (GAUZE/BANDAGES/DRESSINGS) ×3 IMPLANT
CABLE HIGH FREQUENCY MONO STRZ (ELECTRODE) ×3 IMPLANT
CHLORAPREP W/TINT 26ML (MISCELLANEOUS) ×3 IMPLANT
CLOSURE WOUND 1/2 X4 (GAUZE/BANDAGES/DRESSINGS) ×1
DECANTER SPIKE VIAL GLASS SM (MISCELLANEOUS) ×3 IMPLANT
DEVICE SECURE STRAP 25 ABSORB (INSTRUMENTS) ×6 IMPLANT
DEVICE TROCAR PUNCTURE CLOSURE (ENDOMECHANICALS) ×3 IMPLANT
ELECT REM PT RETURN 15FT ADLT (MISCELLANEOUS) ×3 IMPLANT
GAUZE SPONGE 2X2 8PLY STRL LF (GAUZE/BANDAGES/DRESSINGS) ×1 IMPLANT
GLOVE BIO SURGEON STRL SZ7.5 (GLOVE) ×3 IMPLANT
GOWN STRL REUS W/TWL XL LVL3 (GOWN DISPOSABLE) ×9 IMPLANT
IRRIG SUCT STRYKERFLOW 2 WTIP (MISCELLANEOUS) ×3
IRRIGATION SUCT STRKRFLW 2 WTP (MISCELLANEOUS) ×1 IMPLANT
KIT BASIN OR (CUSTOM PROCEDURE TRAY) ×3 IMPLANT
MESH VENTRALIGHT ST 6IN CRC (Mesh General) ×3 IMPLANT
NEEDLE INSUFFLATION 14GA 120MM (NEEDLE) ×3 IMPLANT
PAD POSITIONING PINK XL (MISCELLANEOUS) ×3 IMPLANT
POSITIONER SURGICAL ARM (MISCELLANEOUS) IMPLANT
SCISSORS LAP 5X35 DISP (ENDOMECHANICALS) ×3 IMPLANT
SHEARS HARMONIC ACE PLUS 36CM (ENDOMECHANICALS) IMPLANT
SLEEVE XCEL OPT CAN 5 100 (ENDOMECHANICALS) ×6 IMPLANT
SPONGE GAUZE 2X2 STER 10/PKG (GAUZE/BANDAGES/DRESSINGS) ×2
STRIP CLOSURE SKIN 1/2X4 (GAUZE/BANDAGES/DRESSINGS) ×2 IMPLANT
SUT CHROMIC 2 0 SH (SUTURE) ×3 IMPLANT
SUT ETHIBOND NAB CT1 #1 30IN (SUTURE) ×12 IMPLANT
SUT MNCRL AB 4-0 PS2 18 (SUTURE) ×3 IMPLANT
SUT PROLENE 2 0 KS (SUTURE) ×6 IMPLANT
TAPE CLOTH 4X10 WHT NS (GAUZE/BANDAGES/DRESSINGS) IMPLANT
TOWEL OR 17X26 10 PK STRL BLUE (TOWEL DISPOSABLE) ×3 IMPLANT
TRAY FOLEY W/METER SILVER 16FR (SET/KITS/TRAYS/PACK) IMPLANT
TRAY LAPAROSCOPIC (CUSTOM PROCEDURE TRAY) ×3 IMPLANT
TROCAR BLADELESS OPT 5 100 (ENDOMECHANICALS) ×3 IMPLANT
TROCAR XCEL NON-BLD 11X100MML (ENDOMECHANICALS) IMPLANT
TUBING INSUF HEATED (TUBING) ×3 IMPLANT

## 2017-04-30 NOTE — Progress Notes (Addendum)
Discharge instructions reviewed with pt and his wife, both verbalized understanding of d/c instructions.  Pt is 5/10 pain at this time and this level is acceptable to go home with per pt.  Pt reports that he did not want any pain medication before discharge.  Pt urinated without difficulty and is keeping ginger ale and saltine crackers down at this time. Pt is alert and oriented. Pt dressings having no new drainage from arrival to [facility].  Ice pack given

## 2017-04-30 NOTE — Op Note (Signed)
04/30/2017  11:12 AM  PATIENT:  Shaun Castillo  41 y.o. male  PRE-OPERATIVE DIAGNOSIS:  umbilical hernia  POST-OPERATIVE DIAGNOSIS:  Incarcerated umbilical hernia  PROCEDURE:  Procedure(s): LAPAROSCOPIC UMBILICAL HERNIA REPAIR WITH  MESH (N/A) INSERTION OF MESH (N/A)  SURGEON:  Surgeon(s) and Role:    * Ralene Ok, MD - Primary  ANESTHESIA:   local and general  EBL:  minimal   BLOOD ADMINISTERED:none  DRAINS: none   LOCAL MEDICATIONS USED:  BUPIVICAINE   SPECIMEN:  Source of Specimen:  none  DISPOSITION OF SPECIMEN:  N/A  COUNTS:  YES  TOURNIQUET:  * No tourniquets in log *  DICTATION: .Dragon Dictation   Details of the procedure:   After the patient was consented patient was taken back to the operating room patient was then placed in supine position bilateral SCDs in place.  The patient was prepped and draped in the usual sterile fashion. After antibiotics were confirmed a timeout was called and all facts were verified. The Veress needle technique was used to insuflate the abdomen at Palmer's point. The abdomen was insufflated to 14 mm mercury. Subsequently a 5 mm trocar was placed a camera inserted there was no injury to any intra-abdominal organs.    There was seen to be an incarcerated  umbilical hernia.  A second camera port was in placed into the left lower quadrant.   At this the Falicform ligament was taken down with Bovie cautery maintaining hemostasis. A 47m port was placed in the epigastrium .   I proceeded to reduce the hernia contents which consisted of incarcerated omentum.  The hernia sac was incised and removed from the hernia.  Once the hernia was cleared away, a Bard Ventralight 15.2cm  mesh was inserted into the abdomen.  The mesh was secured circumferentially with am Securestrap tacker in a double crown fashion.  2-0 prolenes were used at 3:00, 6:00, 9:00, and 12:00 as transfascial sutures using a endoclose decive.    The omentum was brought over  the area of the mesh. The pneumoperitoneum was evacuated  & all trocars  were removed. The skin was reapproximated with 4-0  Monocryl sutures in a subcuticular fashion. The skin was dressed with Steri-Strips tape and gauze.  The patient was taken to the recovery room in stable condition.  PLAN OF CARE: Discharge to home after PACU  PATIENT DISPOSITION:  PACU - hemodynamically stable.   Delay start of Pharmacological VTE agent (>24hrs) due to surgical blood loss or risk of bleeding: not applicable

## 2017-04-30 NOTE — Anesthesia Preprocedure Evaluation (Signed)
Anesthesia Evaluation  Patient identified by MRN, date of birth, ID band Patient awake    Reviewed: Allergy & Precautions, NPO status , Patient's Chart, lab work & pertinent test results  History of Anesthesia Complications (+) PONV and history of anesthetic complications  Airway Mallampati: II  TM Distance: >3 FB Neck ROM: Full    Dental no notable dental hx.    Pulmonary neg pulmonary ROS, sleep apnea , former smoker,    Pulmonary exam normal breath sounds clear to auscultation       Cardiovascular hypertension, negative cardio ROS Normal cardiovascular exam Rhythm:Regular Rate:Normal     Neuro/Psych negative neurological ROS  negative psych ROS   GI/Hepatic negative GI ROS, Neg liver ROS,   Endo/Other  negative endocrine ROS  Renal/GU negative Renal ROS     Musculoskeletal negative musculoskeletal ROS (+)   Abdominal (+) + obese,   Peds  Hematology negative hematology ROS (+)   Anesthesia Other Findings   Reproductive/Obstetrics negative OB ROS                            Anesthesia Physical Anesthesia Plan  ASA: III  Anesthesia Plan: General   Post-op Pain Management:    Induction: Intravenous  PONV Risk Score and Plan: 4 or greater and Ondansetron, Dexamethasone and Midazolam  Airway Management Planned:   Additional Equipment:   Intra-op Plan:   Post-operative Plan: Extubation in OR  Informed Consent: I have reviewed the patients History and Physical, chart, labs and discussed the procedure including the risks, benefits and alternatives for the proposed anesthesia with the patient or authorized representative who has indicated his/her understanding and acceptance.   Dental advisory given  Plan Discussed with: CRNA  Anesthesia Plan Comments:         Anesthesia Quick Evaluation

## 2017-04-30 NOTE — Discharge Instructions (Signed)
[facility] _______[facility]  HERNIA REPAIR: POST OP INSTRUCTIONS  Always review your discharge instruction sheet given to you by the facility where your surgery was performed. IF YOU HAVE DISABILITY OR FAMILY LEAVE FORMS, YOU MUST BRING THEM TO THE OFFICE FOR PROCESSING.   DO NOT GIVE THEM TO YOUR DOCTOR.  1. A  prescription for pain medication may be given to you upon discharge.  Take your pain medication as prescribed, if needed.  If narcotic pain medicine is not needed, then you may take acetaminophen (Tylenol) or ibuprofen (Advil) as needed. 2. Take your usually prescribed medications unless otherwise directed. If you need a refill on your pain medication, please contact your pharmacy.  They will contact our office to request authorization. Prescriptions will not be filled after 5 pm or on week-ends. 3. You should follow a light diet the first 24 hours after arrival home, such as soup and crackers, etc.  Be sure to include lots of fluids daily.  Resume your normal diet the day after surgery. 4.Most patients will experience some swelling and bruising around the umbilicus or in the groin and scrotum.  Ice packs and reclining will help.  Swelling and bruising can take several days to resolve.  6. It is common to experience some constipation if taking pain medication after surgery.  Increasing fluid intake and taking a stool softener (such as Colace) will usually help or prevent this problem from occurring.  A mild laxative (Milk of Magnesia or Miralax) should be taken according to package directions if there are no bowel movements after 48 hours. 7. Unless discharge instructions indicate otherwise, you may remove your bandages 24-48 hours after surgery, and you may shower at that time.  You may have steri-strips (small skin tapes) in place directly over the incision.  These strips should be left on the skin for 7-10 days.  If your surgeon used skin glue on the incision, you may shower in  24 hours.  The glue will flake off over the next 2-3 weeks.  Any sutures or staples will be removed at the office during your follow-up visit. 8. ACTIVITIES:  You may resume regular (light) daily activities beginning the next day--such as daily self-care, walking, climbing stairs--gradually increasing activities as tolerated.  You may have sexual intercourse when it is comfortable.  Refrain from any heavy lifting or straining until approved by your doctor.  a.You may drive when you are no longer taking prescription pain medication, you can comfortably wear a seatbelt, and you can safely maneuver your car and apply brakes. b.RETURN TO WORK:   _____________________________________________  9.You should see your doctor in the office for a follow-up appointment approximately 2-3 weeks after your surgery.  Make sure that you call for this appointment within a day or two after you arrive home to insure a convenient appointment time. 10.OTHER INSTRUCTIONS: _________________________    _____________________________________  WHEN TO CALL YOUR DOCTOR: 1. Fever over 101.0 2. Inability to urinate 3. Nausea and/or vomiting 4. Extreme swelling or bruising 5. Continued bleeding from incision. 6. Increased pain, redness, or drainage from the incision  The clinic staff is available to answer your questions during regular business hours.  Please dont hesitate to call and ask to speak to one of the nurses for clinical concerns.  If you have a medical emergency, go to the nearest emergency room or call 911.  A surgeon from [facility] is always on call at the hospital   [name]  [address], [place], [place], [place]  [address] ?  [address], [place],    [address] [phone] ? [phone] ? FAX [phone] Web site: [email or web address]  General Anesthesia, Adult, Care After These instructions provide you with information about caring for yourself after your procedure.  Your health care provider may also give you more specific instructions. Your treatment has been planned according to current medical practices, but problems sometimes occur. Call your health care provider if you have any problems or questions after your procedure. What can I expect after the procedure? After the procedure, it is common to have:  Vomiting.  A sore throat.  Mental slowness.  It is common to feel:  Nauseous.  Cold or shivery.  Sleepy.  Tired.  Sore or achy, even in parts of your body where you did not have surgery.  Follow these instructions at home: For at least 24 hours after the procedure:  Do not: ? Participate in activities where you could fall or become injured. ? Drive. ? Use heavy machinery. ? Drink alcohol. ? Take sleeping pills or medicines that cause drowsiness. ? Make important decisions or sign legal documents. ? Take care of children on your own.  Rest. Eating and drinking  If you vomit, drink water, juice, or soup when you can drink without vomiting.  Drink enough fluid to keep your urine clear or pale yellow.  Make sure you have little or no nausea before eating solid foods.  Follow the diet recommended by your health care provider. General instructions  Have a responsible adult stay with you until you are awake and alert.  Return to your normal activities as told by your health care provider. Ask your health care provider what activities are safe for you.  Take over-the-counter and prescription medicines only as told by your health care provider.  If you smoke, do not smoke without supervision.  Keep all follow-up visits as told by your health care provider. This is important. Contact a health care provider if:  You continue to have nausea or vomiting at home, and medicines are not helpful.  You cannot drink fluids or start eating again.  You cannot urinate after 8-12 hours.  You develop a skin rash.  You have  fever.  You have increasing redness at the site of your procedure. Get help right away if:  You have difficulty breathing.  You have chest pain.  You have unexpected bleeding.  You feel that you are having a life-threatening or urgent problem. This information is not intended to replace advice given to you by your health care provider. Make sure you discuss any questions you have with your health care provider. Document Released: 07/29/2000 Document Revised: 09/25/2015 Document Reviewed: 04/06/2015 Elsevier Interactive Patient Education  [employer].

## 2017-04-30 NOTE — Transfer of Care (Signed)
Immediate Anesthesia Transfer of Care Note  Patient: Shaun Castillo  Procedure(s) Performed: Procedure(s): LAPAROSCOPIC UMBILICAL HERNIA REPAIR WITH  MESH (N/A) INSERTION OF MESH (N/A)  Patient Location: PACU  Anesthesia Type:General  Level of Consciousness:  sedated, patient cooperative and responds to stimulation  Airway & Oxygen Therapy:Patient Spontanous Breathing and Patient connected to face mask oxgen  Post-op Assessment:  Report given to PACU RN and Post -op Vital signs reviewed and stable  Post vital signs:  Reviewed and stable  Last Vitals:  Vitals:   04/30/17 0748  BP: (!) 159/80  Pulse: 91  Resp: 16  Temp: 37 C  SpO2: 193%    Complications: No apparent anesthesia complications

## 2017-04-30 NOTE — Anesthesia Procedure Notes (Signed)
Procedure Name: Intubation Date/Time: 04/30/2017 10:01 AM Performed by: Anne Fu, CRNA Pre-anesthesia Checklist: Patient identified, Emergency Drugs available, Suction available, Patient being monitored and Timeout performed Patient Re-evaluated:Patient Re-evaluated prior to induction Oxygen Delivery Method: Circle system utilized Preoxygenation: Pre-oxygenation with 100% oxygen Induction Type: IV induction Ventilation: Two handed mask ventilation required Laryngoscope Size: Mac and 4 Grade View: Grade III Tube type: Oral Tube size: 7.5 mm Number of attempts: 1 Airway Equipment and Method: Video-laryngoscopy and Stylet Placement Confirmation: ETT inserted through vocal cords under direct vision,  positive ETCO2 and breath sounds checked- equal and bilateral Secured at: 21 cm Tube secured with: Tape Dental Injury: Teeth and Oropharynx as per pre-operative assessment  Comments: Attempt 1 with DL MAC 4 noted grade 3 view converted to video scope with grade 1 view passed without incidences bilat lung sounds noted post intubation.

## 2017-04-30 NOTE — Interval H&P Note (Signed)
History and Physical Interval Note:  04/30/2017 9:18 AM  Shaun Castillo  has presented today for surgery, with the diagnosis of umbilical hernia  The various methods of treatment have been discussed with the patient and family. After consideration of risks, benefits and other options for treatment, the patient has consented to  Procedure(s): [place] (N/A) INSERTION OF MESH (N/A) as a surgical intervention .  The patient's history has been reviewed, patient examined, no change in status, stable for surgery.  I have reviewed the patient's chart and labs.  Questions were answered to the patient's satisfaction.     Rosario Jacks., Anne Hahn

## 2017-05-02 NOTE — Anesthesia Postprocedure Evaluation (Signed)
Anesthesia Post Note  Patient: Shaun Castillo  Procedure(s) Performed: LAPAROSCOPIC UMBILICAL HERNIA REPAIR WITH  MESH (N/A Abdomen) INSERTION OF MESH (N/A Abdomen)     Patient location during evaluation: PACU Anesthesia Type: General Level of consciousness: sedated and patient cooperative Pain management: pain level controlled Vital Signs Assessment: post-procedure vital signs reviewed and stable Respiratory status: spontaneous breathing Cardiovascular status: stable Anesthetic complications: no    Last Vitals:  Vitals:   04/30/17 1210 04/30/17 1353  BP: (!) 150/75 132/65  Pulse: 90 73  Resp: 18 16  Temp: 36.7 C 36.7 C  SpO2: 98% 97%    Last Pain:  Vitals:   04/30/17 1353  TempSrc: Oral  PainSc: 4    Pain Goal: Patients Stated Pain Goal: 4 (04/30/17 0745)               Nolon Nations

## 2017-05-05 DIAGNOSIS — M62830 Muscle spasm of back: Secondary | ICD-10-CM | POA: Diagnosis not present

## 2017-05-05 DIAGNOSIS — M9905 Segmental and somatic dysfunction of pelvic region: Secondary | ICD-10-CM | POA: Diagnosis not present

## 2017-05-05 DIAGNOSIS — M9903 Segmental and somatic dysfunction of lumbar region: Secondary | ICD-10-CM | POA: Diagnosis not present

## 2017-05-05 DIAGNOSIS — M9904 Segmental and somatic dysfunction of sacral region: Secondary | ICD-10-CM | POA: Diagnosis not present

## 2017-05-29 DIAGNOSIS — G4733 Obstructive sleep apnea (adult) (pediatric): Secondary | ICD-10-CM | POA: Diagnosis not present

## 2017-09-05 DIAGNOSIS — G4733 Obstructive sleep apnea (adult) (pediatric): Secondary | ICD-10-CM | POA: Diagnosis not present

## 2017-12-16 DIAGNOSIS — G4733 Obstructive sleep apnea (adult) (pediatric): Secondary | ICD-10-CM | POA: Diagnosis not present

## 2018-03-10 DIAGNOSIS — Z0000 Encounter for general adult medical examination without abnormal findings: Secondary | ICD-10-CM | POA: Diagnosis not present

## 2018-03-10 DIAGNOSIS — R82998 Other abnormal findings in urine: Secondary | ICD-10-CM | POA: Diagnosis not present

## 2018-03-10 DIAGNOSIS — R7309 Other abnormal glucose: Secondary | ICD-10-CM | POA: Diagnosis not present

## 2018-03-10 DIAGNOSIS — Z125 Encounter for screening for malignant neoplasm of prostate: Secondary | ICD-10-CM | POA: Diagnosis not present

## 2018-03-10 DIAGNOSIS — I10 Essential (primary) hypertension: Secondary | ICD-10-CM | POA: Diagnosis not present

## 2018-03-23 DIAGNOSIS — R808 Other proteinuria: Secondary | ICD-10-CM | POA: Diagnosis not present

## 2018-03-23 DIAGNOSIS — Z1389 Encounter for screening for other disorder: Secondary | ICD-10-CM | POA: Diagnosis not present

## 2018-03-23 DIAGNOSIS — I10 Essential (primary) hypertension: Secondary | ICD-10-CM | POA: Diagnosis not present

## 2018-03-23 DIAGNOSIS — Z0000 Encounter for general adult medical examination without abnormal findings: Secondary | ICD-10-CM | POA: Diagnosis not present

## 2018-03-23 DIAGNOSIS — F418 Other specified anxiety disorders: Secondary | ICD-10-CM | POA: Diagnosis not present

## 2018-04-03 DIAGNOSIS — G4733 Obstructive sleep apnea (adult) (pediatric): Secondary | ICD-10-CM | POA: Diagnosis not present

## 2018-06-03 IMAGING — CR DG FOOT COMPLETE 3+V*R*
3 series · 3 of 3 positions shown · non-contrast
Comparison: None.

CLINICAL DATA: Puncture wound of foot.

EXAM:
RIGHT FOOT COMPLETE - 3+ VIEW

[x foot ap right]
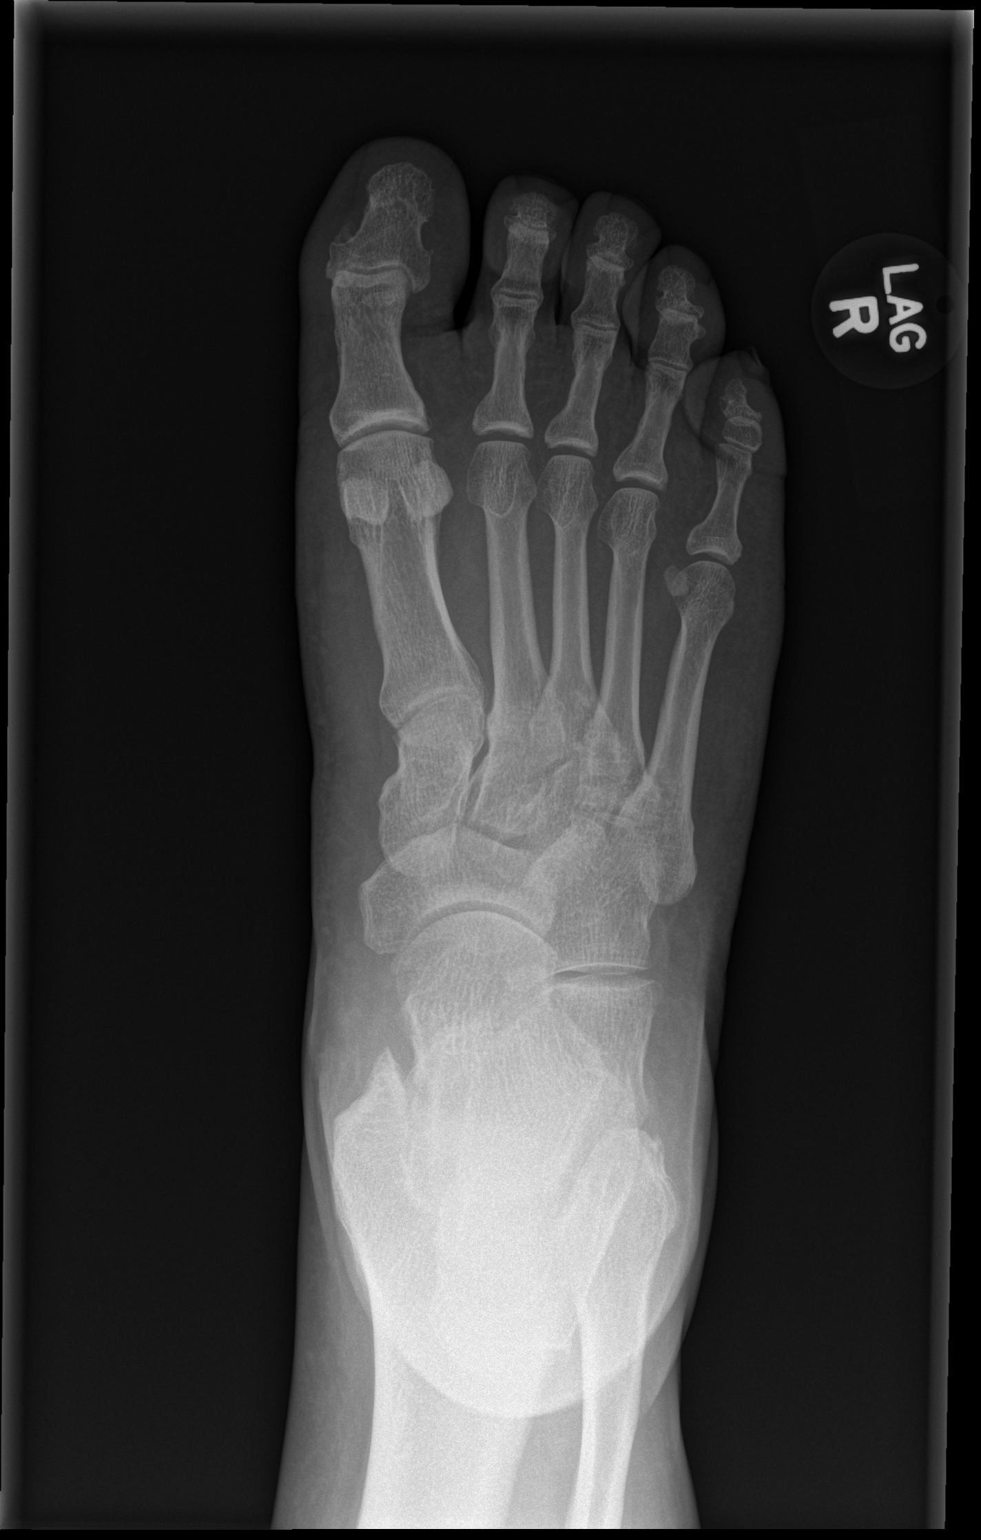

[x foot obl right]
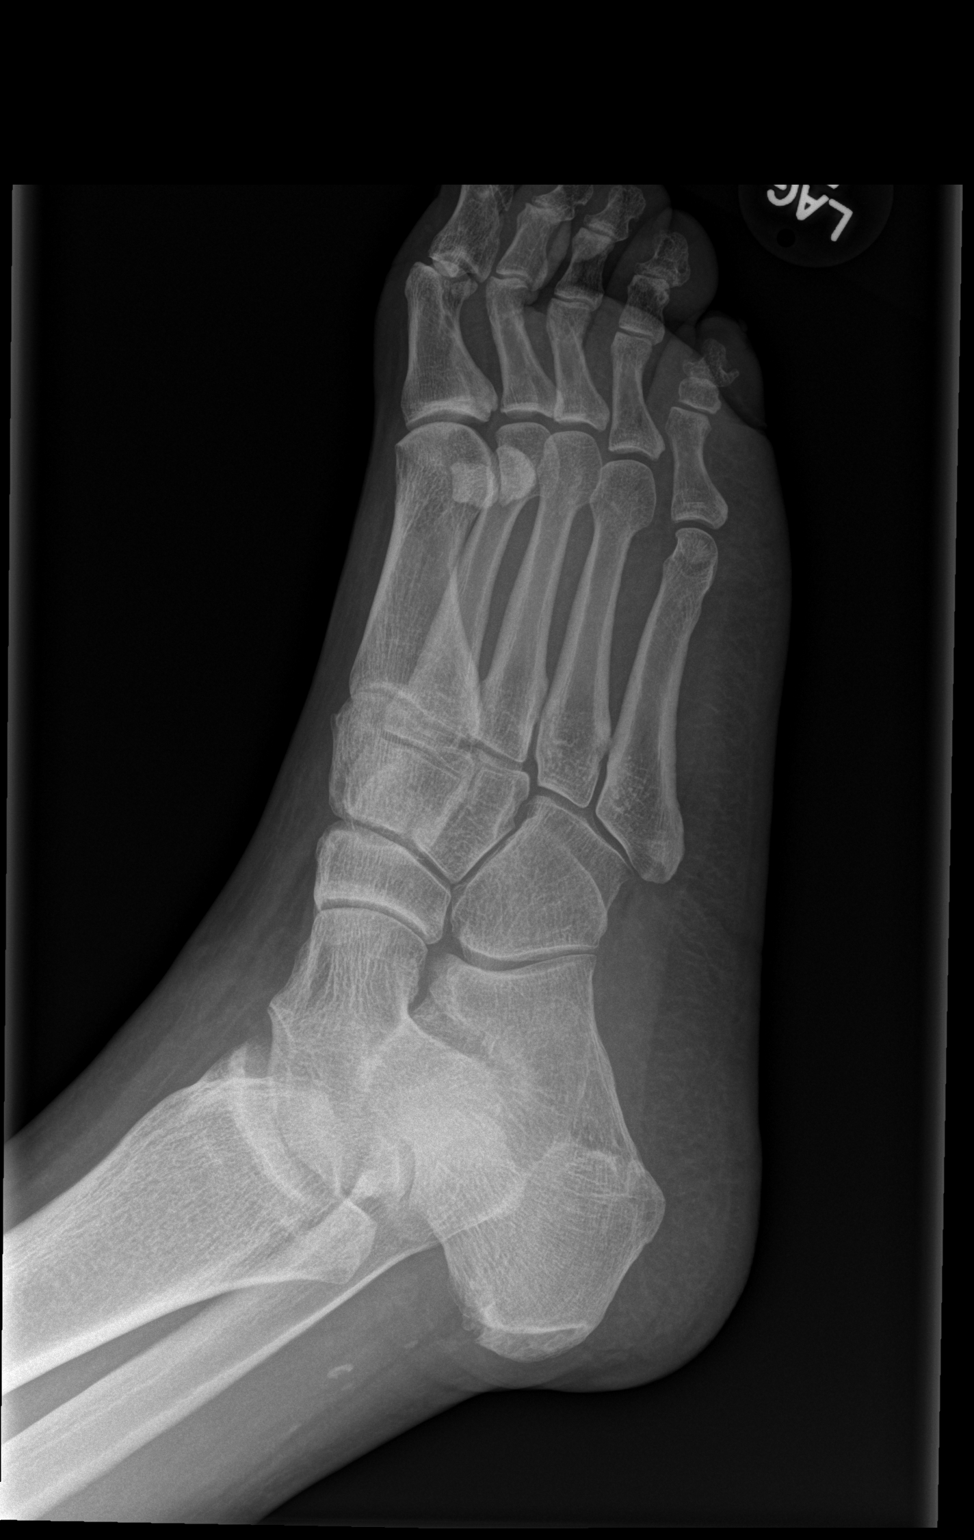

[x foot lat right]
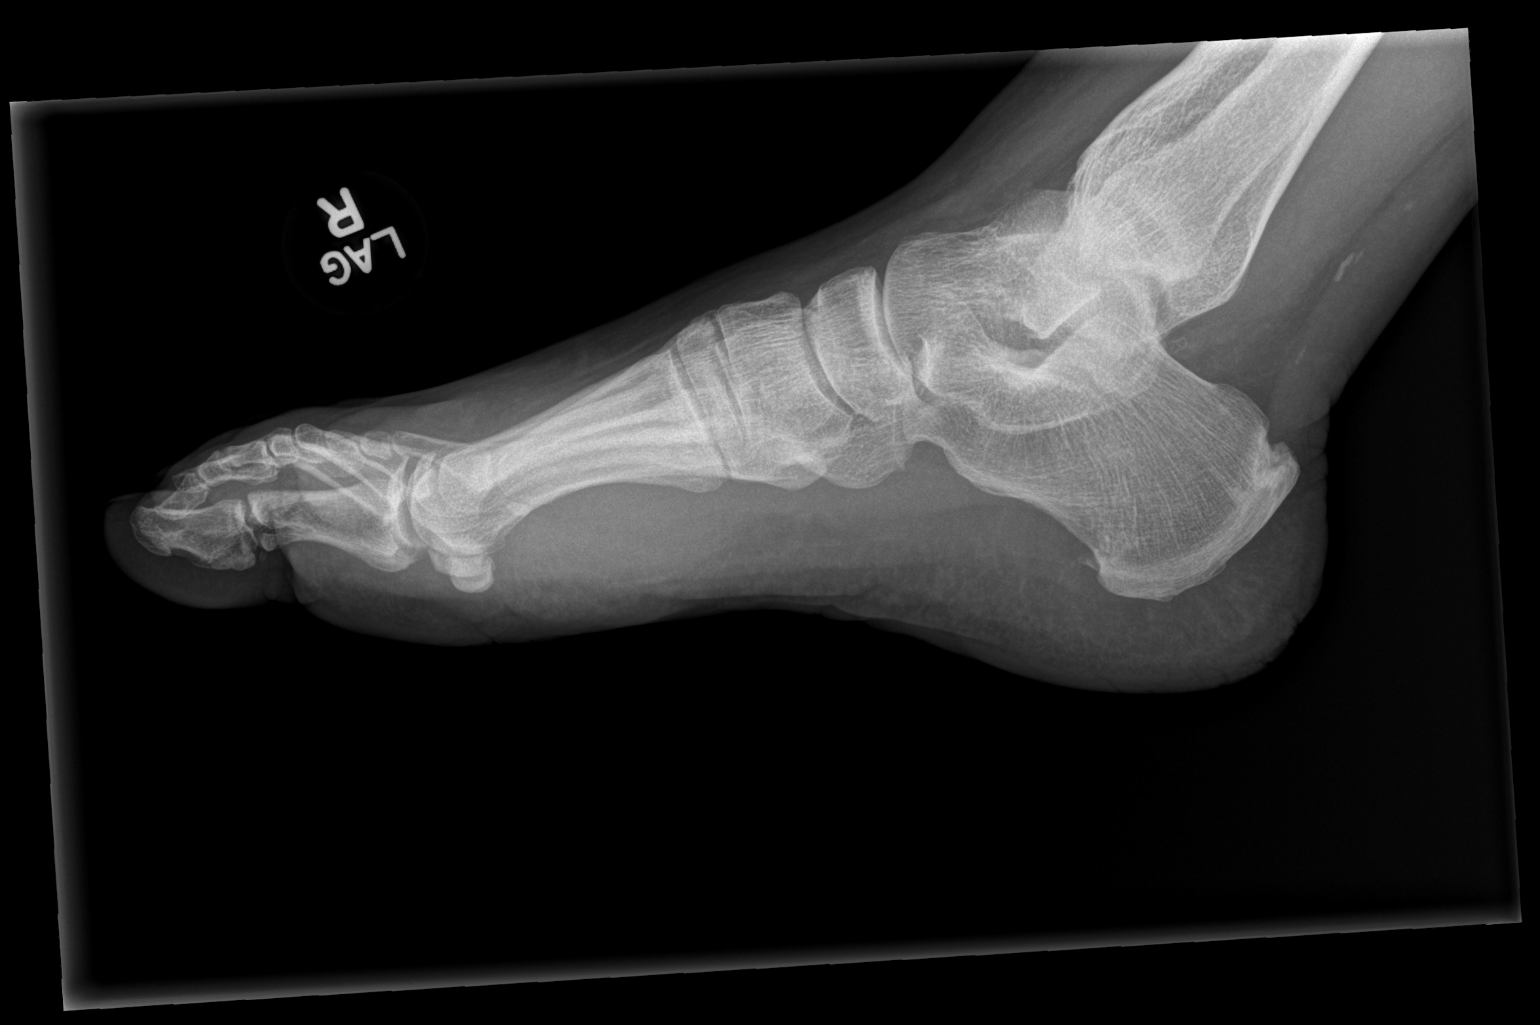

[3 of 3 positions shown; findings below may reference images not displayed]

FINDINGS: There is no evidence of fracture or dislocation. There is no
evidence of arthropathy. Mild spurring of posterior calcaneus is
noted. Soft tissues are unremarkable.
IMPRESSION: No acute abnormality seen in the right foot. No radiopaque foreign
body is seen.

## 2019-06-18 ENCOUNTER — Ambulatory Visit: Payer: 59 | Attending: Internal Medicine

## 2019-06-18 ENCOUNTER — Ambulatory Visit: Payer: BLUE CROSS/BLUE SHIELD

## 2019-06-18 DIAGNOSIS — Z23 Encounter for immunization: Secondary | ICD-10-CM | POA: Insufficient documentation

## 2019-06-18 NOTE — Progress Notes (Signed)
   Covid-19 Vaccination Clinic  Name:  Shaun Castillo    MRN: [id number] DOB: [birth year]  06/18/2019  Mr. Cellucci was observed post Covid-19 immunization for 15 minutes without incidence. He was provided with Vaccine Information Sheet and instruction to access the V-Safe system.   Mr. Antillon was instructed to call 911 with any severe reactions post vaccine: Marland Kitchen Difficulty breathing  . Swelling of your face and throat  . A fast heartbeat  . A bad rash all over your body  . Dizziness and weakness    Immunizations Administered    Name Date Dose VIS Date Route   Pfizer COVID-19 Vaccine 06/18/2019  4:59 PM 0.3 mL 04/16/2019 Intramuscular   Manufacturer: [place]   Lot: [id number]   [place]: [id number]

## 2019-07-11 ENCOUNTER — Ambulatory Visit: Payer: 59 | Attending: Internal Medicine

## 2019-07-11 DIAGNOSIS — Z23 Encounter for immunization: Secondary | ICD-10-CM

## 2019-07-11 NOTE — Progress Notes (Signed)
   Covid-19 Vaccination Clinic  Name:  Shaun Castillo    MRN: [id number] DOB: 07/31/1975  07/11/2019  [name] was observed post Covid-19 immunization for 15 minutes without incident. He was provided with Vaccine Information Sheet and instruction to access the V-Safe system.   [name] was instructed to call 911 with any severe reactions post vaccine: Marland Kitchen Difficulty breathing  . Swelling of face and throat  . A fast heartbeat  . A bad rash all over body  . Dizziness and weakness   Immunizations Administered    Name Date Dose VIS Date Route   Pfizer COVID-19 Vaccine 07/11/2019 11:12 AM 0.3 mL 04/16/2019 Intramuscular   Manufacturer: [place]   Lot: [id number]   [place]: [id number]
